# Patient Record
Sex: Male | Born: 1990 | Race: White | Hispanic: No | Marital: Single | Smoking: Light tobacco smoker
Health system: Southern US, Community
[De-identification: ages and names within clinical notes are randomized; demographics above are authoritative.]

## PROBLEM LIST (undated history)

## (undated) DIAGNOSIS — F32A Depression, unspecified: Secondary | ICD-10-CM

## (undated) DIAGNOSIS — F909 Attention-deficit hyperactivity disorder, unspecified type: Secondary | ICD-10-CM

## (undated) DIAGNOSIS — S060XAA Concussion with loss of consciousness status unknown, initial encounter: Secondary | ICD-10-CM

## (undated) DIAGNOSIS — F419 Anxiety disorder, unspecified: Secondary | ICD-10-CM

## (undated) DIAGNOSIS — S060X9A Concussion with loss of consciousness of unspecified duration, initial encounter: Secondary | ICD-10-CM

## (undated) DIAGNOSIS — F329 Major depressive disorder, single episode, unspecified: Secondary | ICD-10-CM

## (undated) DIAGNOSIS — N200 Calculus of kidney: Secondary | ICD-10-CM

## (undated) HISTORY — PX: LITHOTRIPSY: SUR834

---

## 2012-10-10 ENCOUNTER — Emergency Department: Payer: Self-pay | Admitting: Emergency Medicine

## 2012-10-10 LAB — COMPREHENSIVE METABOLIC PANEL
Albumin: 4.3 g/dL (ref 3.8–5.6)
Anion Gap: 5 — ABNORMAL LOW (ref 7–16)
Calcium, Total: 9.4 mg/dL (ref 9.0–10.7)
Co2: 28 mmol/L (ref 21–32)
Creatinine: 1.08 mg/dL (ref 0.60–1.30)
EGFR (African American): >60
Osmolality: 277 (ref 275–301)
Total Protein: 7.6 g/dL (ref 6.4–8.6)

## 2012-10-10 LAB — CBC
MCHC: 34 g/dL (ref 32.0–36.0)
MCV: 90 fL (ref 80–100)
Platelet: 192 10*3/uL (ref 150–440)
RDW: 13 % (ref 11.5–14.5)

## 2012-10-10 LAB — URINALYSIS, COMPLETE
Bacteria: NONE SEEN
Bilirubin,UR: NEGATIVE
Leukocyte Esterase: NEGATIVE
Nitrite: NEGATIVE
RBC,UR: 64 /HPF (ref 0–5)
WBC UR: 1 /HPF (ref 0–5)

## 2014-01-26 IMAGING — CT CT ABD-PELV W/ CM
1 of 2 series · 15 of 32 positions shown, 19 images · non-contrast
Comparison: none

REASON FOR EXAM: (1) RLQ pain; (2) RLQ pain
COMMENTS:   May transport without cardiac monitor

[Series 2: soft tissue · axial · 0.66mm/px · z∈[-500,-96]mm · 15 of 149 slices shown, 19 images]
[im 7/149  soft-tissue]
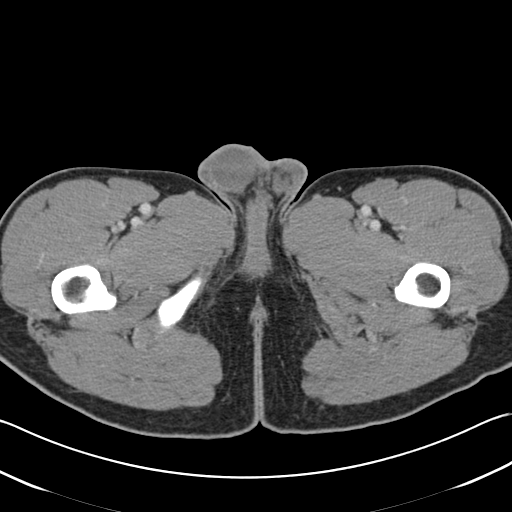
[im 7/149  bone]
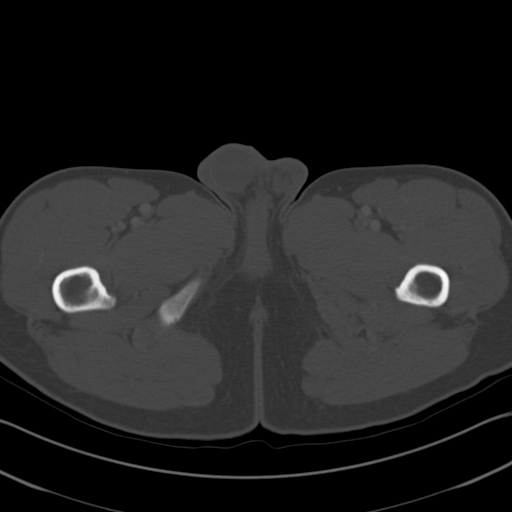
[im 19/149  soft-tissue]
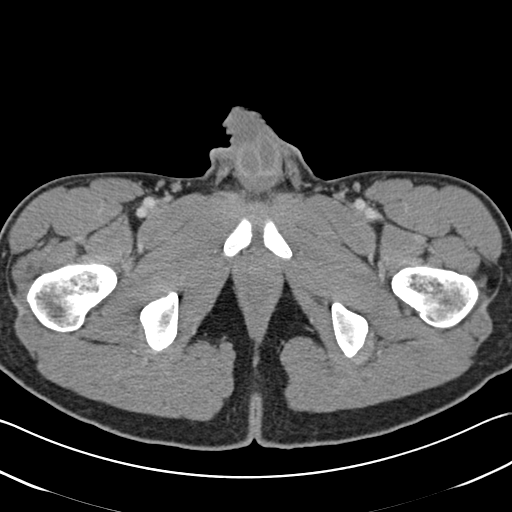
[im 31/149  soft-tissue]
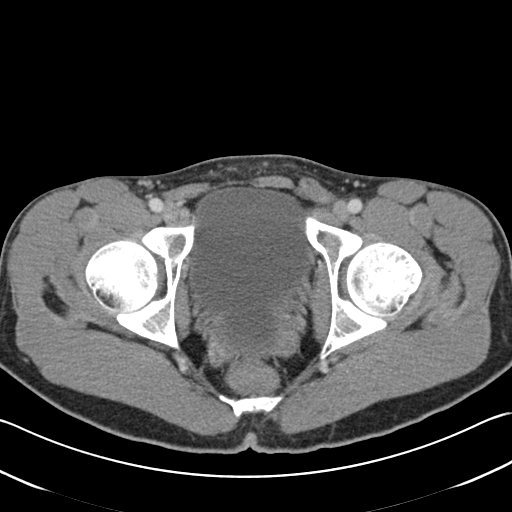
[im 44/149  soft-tissue]
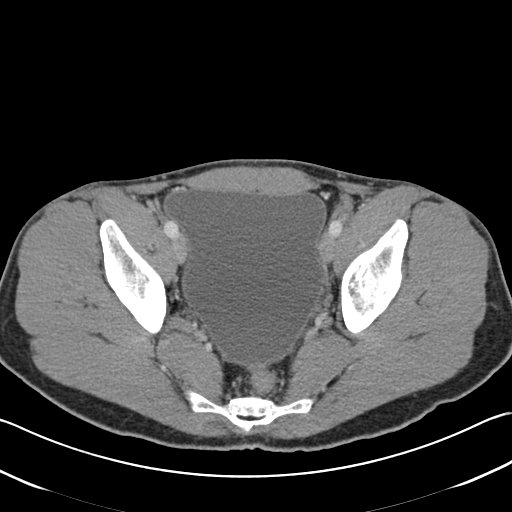
[im 50/149  soft-tissue]
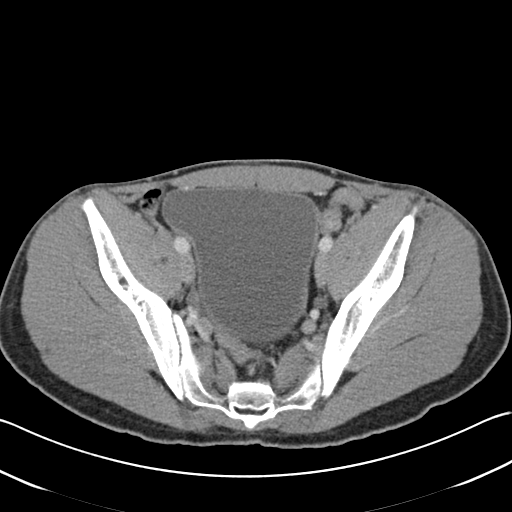
[im 62/149  soft-tissue]
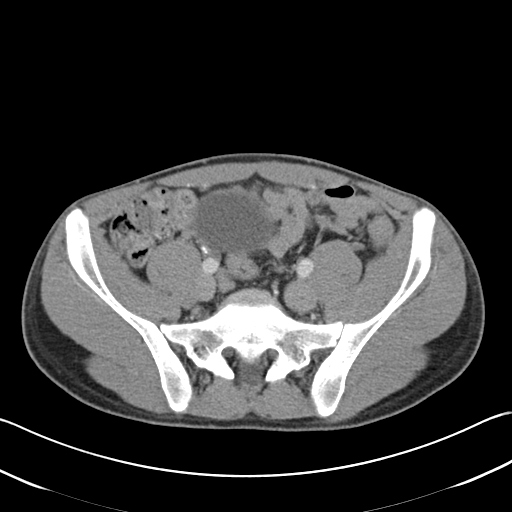
[im 75/149  soft-tissue]
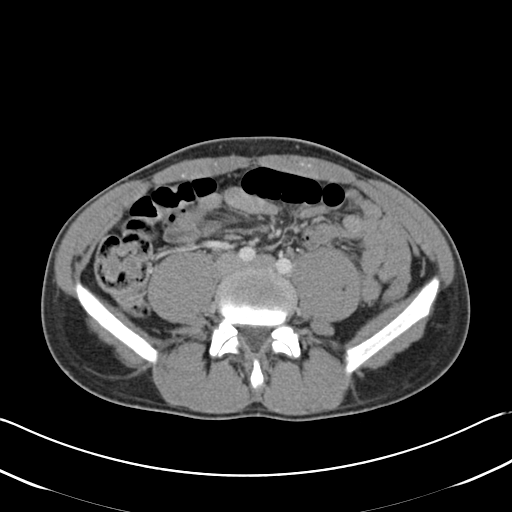
[im 87/149  soft-tissue]
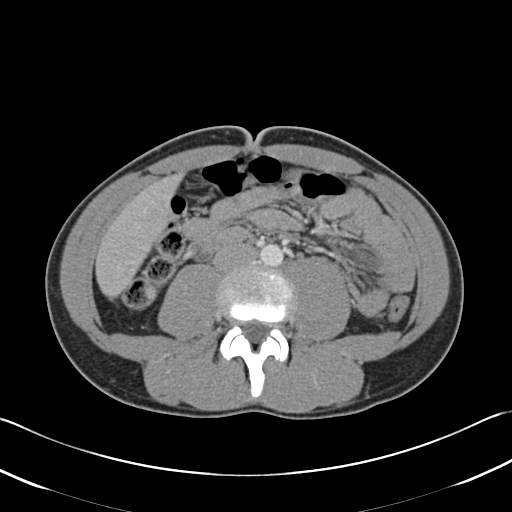
[im 99/149  soft-tissue]
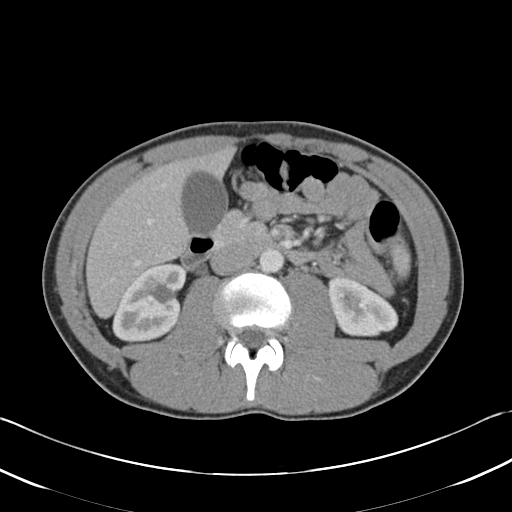
[im 99/149  bone]
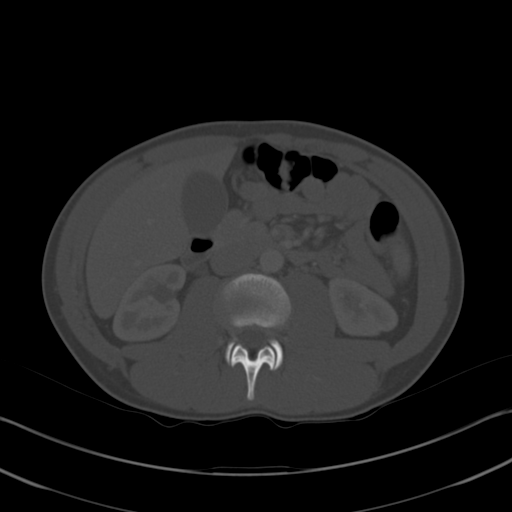
[im 105/149  soft-tissue]
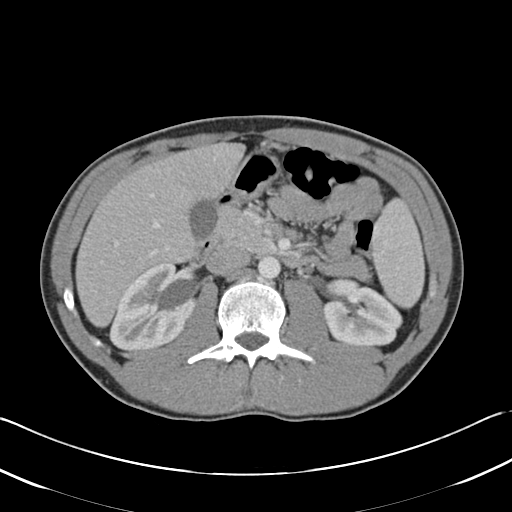
[im 118/149  soft-tissue]
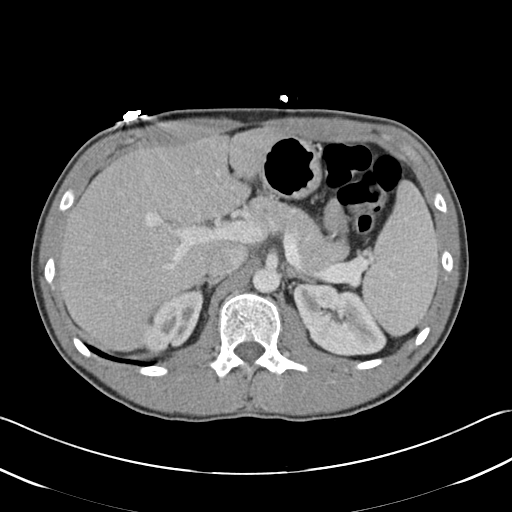
[im 124/149  lung]
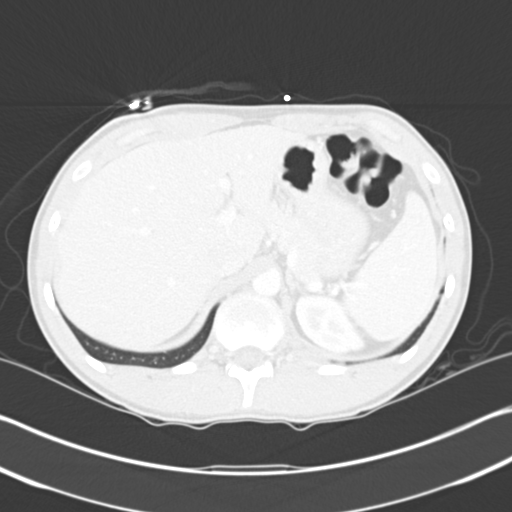
[im 130/149  soft-tissue]
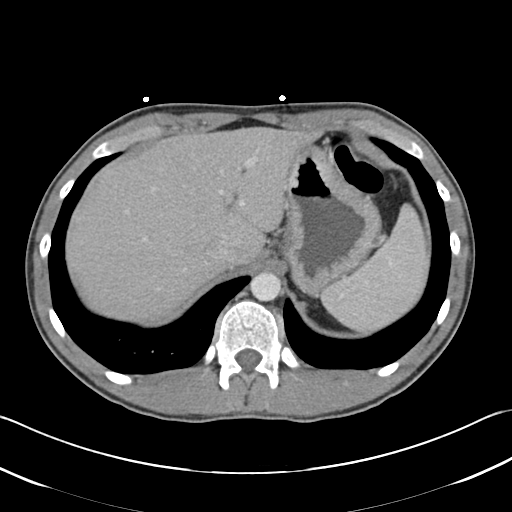
[im 130/149  lung]
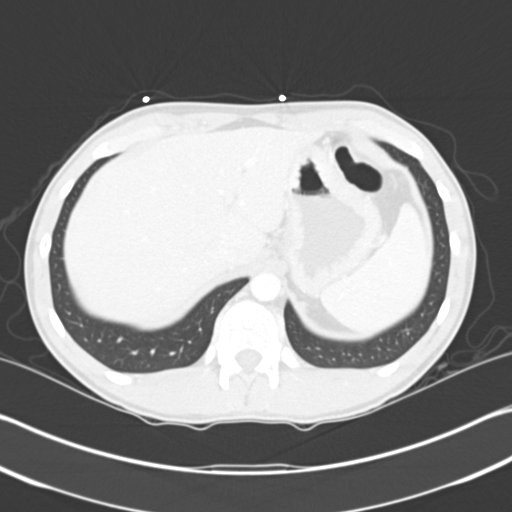
[im 136/149  lung]
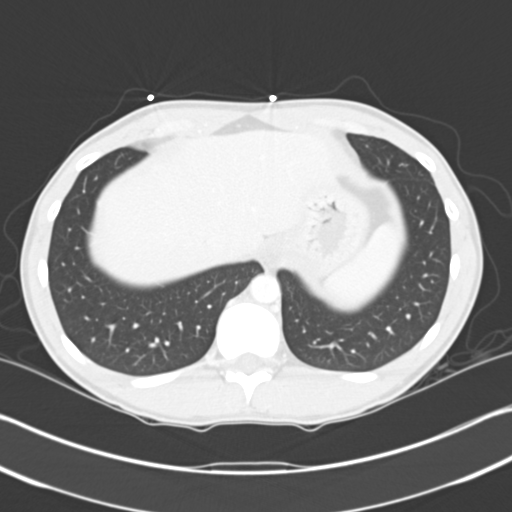
[im 142/149  soft-tissue]
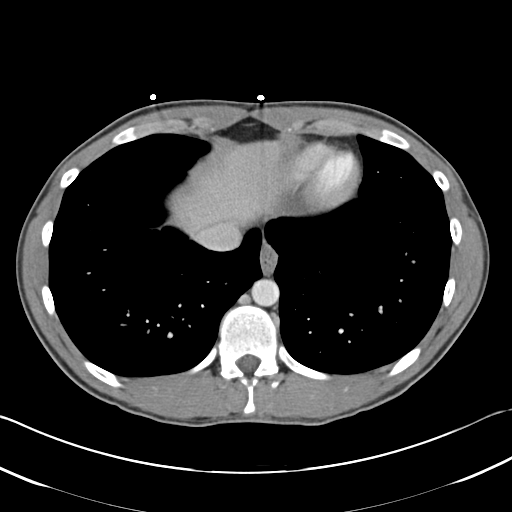
[im 142/149  lung]
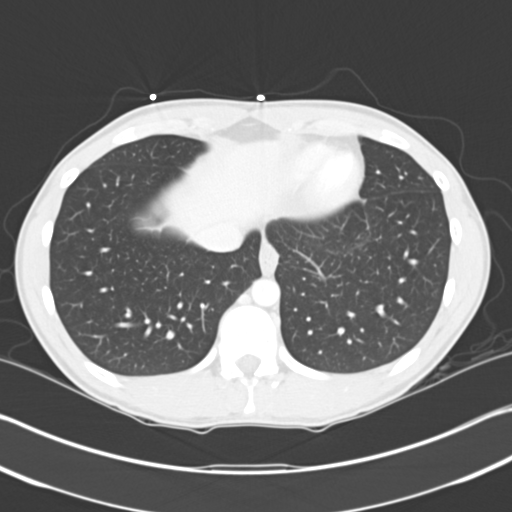

[15 of 32 positions shown; findings below may reference images not displayed]

PROCEDURE:     CT  - CT ABDOMEN / PELVIS  W  - October 10, 2012  [DATE]

RESULT:     CT scanning was performed through the abdomen and pelvis with
reconstructions at 3 mm intervals and slice thicknesses following
intravenous administration of 100 cc of Zsovue-ZUU. The patient did not
receive oral contrast material.

The urinary bladder is markedly distended. The prostate gland and seminal
vesicles are grossly normal for age. The kidneys exhibit no hydronephrosis.
There is a nonobstructing 2 mm diameter stone in an upper pole calyx on the
right.

The unopacified loops of small and large bowel exhibit no evidence of ileus
nor of obstruction. There is a structure which may reflect the appendix
lying along the right posterior aspect of the urinary bladder. It is not
clearly inflated. There is no free fluid in the abdomen or pelvis.

The liver, gallbladder, spleen, partially distended stomach, adrenal glands,
and pancreas exhibit no acute abnormalities. The caliber of the abdominal
aorta is normal. There is no periaortic nor pericaval lymphadenopathy.

The lung bases are clear. The lumbar vertebral bodies are preserved in
height.
IMPRESSION: 1. There is marked distention of the urinary bladder that is of uncertain
etiology. If the patient is unable to void, Foley catheterization may be
useful.
2. I do not see objective evidence of an inflamed appendix. A structure most
compatible with a normal calibered appendix with a possible appendicolith is
noted posterior to the right aspect of the urinary bladder on images 88
through 104.
3. There is no evidence of bowel obstruction or ileus.
4. There is no acute hepatobiliary abnormality.
5. There is a nonobstructing upper pole stone in the right kidney. There is
no evidence of pyelonephritis.

[REDACTED]

## 2014-11-21 ENCOUNTER — Emergency Department
Admit: 2014-11-21 | Disposition: A | Discharge status: Home / Self Care | Payer: Self-pay | Admitting: Emergency Medicine
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
## 2016-04-14 ENCOUNTER — Emergency Department
Admission: EM | Admit: 2016-04-14 | Discharge: 2016-04-14 | Disposition: A | Discharge status: Home / Self Care | Payer: 59 | Attending: Student in an Organized Health Care Education/Training Program | Admitting: Student in an Organized Health Care Education/Training Program

## 2016-04-14 ENCOUNTER — Encounter: Payer: Self-pay | Admitting: Emergency Medicine

## 2016-04-14 ENCOUNTER — Emergency Department: Payer: 59

## 2016-04-14 DIAGNOSIS — Y999 Unspecified external cause status: Secondary | ICD-10-CM | POA: Diagnosis not present

## 2016-04-14 DIAGNOSIS — Y9302 Activity, running: Secondary | ICD-10-CM | POA: Diagnosis not present

## 2016-04-14 DIAGNOSIS — S9031XA Contusion of right foot, initial encounter: Secondary | ICD-10-CM | POA: Diagnosis not present

## 2016-04-14 DIAGNOSIS — Y92520 Airport as the place of occurrence of the external cause: Secondary | ICD-10-CM | POA: Diagnosis not present

## 2016-04-14 DIAGNOSIS — W230XXA Caught, crushed, jammed, or pinched between moving objects, initial encounter: Secondary | ICD-10-CM | POA: Diagnosis not present

## 2016-04-14 DIAGNOSIS — F909 Attention-deficit hyperactivity disorder, unspecified type: Secondary | ICD-10-CM | POA: Diagnosis not present

## 2016-04-14 DIAGNOSIS — S9781XA Crushing injury of right foot, initial encounter: Secondary | ICD-10-CM

## 2016-04-14 DIAGNOSIS — F17200 Nicotine dependence, unspecified, uncomplicated: Secondary | ICD-10-CM | POA: Diagnosis not present

## 2016-04-14 DIAGNOSIS — S99921A Unspecified injury of right foot, initial encounter: Secondary | ICD-10-CM | POA: Diagnosis present

## 2016-04-14 HISTORY — DX: Calculus of kidney: N20.0

## 2016-04-14 HISTORY — DX: Major depressive disorder, single episode, unspecified: F32.9

## 2016-04-14 HISTORY — DX: Anxiety disorder, unspecified: F41.9

## 2016-04-14 HISTORY — DX: Attention-deficit hyperactivity disorder, unspecified type: F90.9

## 2016-04-14 HISTORY — DX: Depression, unspecified: F32.A

## 2016-04-14 HISTORY — DX: Concussion with loss of consciousness of unspecified duration, initial encounter: S06.0X9A

## 2016-04-14 HISTORY — DX: Concussion with loss of consciousness status unknown, initial encounter: S06.0XAA

## 2016-04-14 MED ORDER — TRAMADOL HCL 50 MG PO TABS: 50.0000 mg | ORAL_TABLET | Freq: Four times a day (QID) | ORAL | 0 refills | Status: DC | PRN

## 2016-04-14 MED ORDER — NAPROXEN 500 MG PO TABS: 500.0000 mg | ORAL_TABLET | Freq: Two times a day (BID) | ORAL | 0 refills | Status: DC
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
## 2016-04-14 NOTE — Discharge Instructions (Signed)
Follow up with Dr. Orland Jarredroxler for symptoms that are not improving over the week.  Return to the ER for symptoms that change or worsen if unable to schedule an appointment.

## 2016-04-14 NOTE — ED Notes (Signed)
Reviewed d/c instructions, follow-up care and prescriptions with pt. Pt verbalized understanding 
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
## 2016-05-06 ENCOUNTER — Emergency Department
Admission: EM | Admit: 2016-05-06 | Discharge: 2016-05-10 | Disposition: A | Discharge status: Other Acute Inpatient Hospital | Payer: 59 | Attending: Emergency Medicine | Admitting: Emergency Medicine

## 2016-05-06 ENCOUNTER — Encounter: Payer: Self-pay | Admitting: Emergency Medicine

## 2016-05-06 DIAGNOSIS — Z5181 Encounter for therapeutic drug level monitoring: Secondary | ICD-10-CM | POA: Insufficient documentation

## 2016-05-06 DIAGNOSIS — R4689 Other symptoms and signs involving appearance and behavior: Secondary | ICD-10-CM | POA: Diagnosis present

## 2016-05-06 DIAGNOSIS — F17210 Nicotine dependence, cigarettes, uncomplicated: Secondary | ICD-10-CM | POA: Insufficient documentation

## 2016-05-06 DIAGNOSIS — F3160 Bipolar disorder, current episode mixed, unspecified: Secondary | ICD-10-CM | POA: Insufficient documentation

## 2016-05-06 DIAGNOSIS — F909 Attention-deficit hyperactivity disorder, unspecified type: Secondary | ICD-10-CM | POA: Insufficient documentation

## 2016-05-06 DIAGNOSIS — F3164 Bipolar disorder, current episode mixed, severe, with psychotic features: Secondary | ICD-10-CM

## 2016-05-06 LAB — URINE DRUG SCREEN, QUALITATIVE (ARMC ONLY)
Amphetamines, Ur Screen: NOT DETECTED
Barbiturates, Ur Screen: NOT DETECTED
Benzodiazepine, Ur Scrn: NOT DETECTED
CANNABINOID 50 NG, UR ~~LOC~~: POSITIVE — AB
COCAINE METABOLITE, UR ~~LOC~~: NOT DETECTED
MDMA (ECSTASY) UR SCREEN: NOT DETECTED
Methadone Scn, Ur: NOT DETECTED
OPIATE, UR SCREEN: NOT DETECTED
PHENCYCLIDINE (PCP) UR S: NOT DETECTED
Tricyclic, Ur Screen: NOT DETECTED

## 2016-05-06 LAB — COMPREHENSIVE METABOLIC PANEL
ALT: 44 U/L (ref 17–63)
AST: 36 U/L (ref 15–41)
Albumin: 5.2 g/dL — ABNORMAL HIGH (ref 3.5–5.0)
Alkaline Phosphatase: 79 U/L (ref 38–126)
Anion gap: 8 (ref 5–15)
BUN: 18 mg/dL (ref 6–20)
CO2: 27 mmol/L (ref 22–32)
CREATININE: 1.1 mg/dL (ref 0.61–1.24)
Calcium: 9.8 mg/dL (ref 8.9–10.3)
Chloride: 103 mmol/L (ref 101–111)
GFR calc Af Amer: >60 mL/min (ref >60)
Glucose, Bld: 93 mg/dL (ref 65–99)
Potassium: 3.8 mmol/L (ref 3.5–5.1)
Sodium: 138 mmol/L (ref 135–145)
Total Bilirubin: 0.9 mg/dL (ref 0.3–1.2)
Total Protein: 7.8 g/dL (ref 6.5–8.1)

## 2016-05-06 LAB — SALICYLATE LEVEL: Salicylate Lvl: <4 mg/dL (ref 2.8–30.0)

## 2016-05-06 LAB — CBC
HCT: 45 % (ref 40.0–52.0)
Hemoglobin: 15.2 g/dL (ref 13.0–18.0)
MCH: 30.1 pg (ref 26.0–34.0)
MCHC: 33.9 g/dL (ref 32.0–36.0)
MCV: 88.7 fL (ref 80.0–100.0)
Platelets: 202 10*3/uL (ref 150–440)
RBC: 5.07 MIL/uL (ref 4.40–5.90)
RDW: 13 % (ref 11.5–14.5)
WBC: 7.7 10*3/uL (ref 3.8–10.6)

## 2016-05-06 LAB — ACETAMINOPHEN LEVEL: Acetaminophen (Tylenol), Serum: <10 ug/mL — ABNORMAL LOW (ref 10–30)

## 2016-05-06 LAB — ETHANOL

## 2016-05-06 MED ORDER — DIPHENHYDRAMINE HCL 25 MG PO CAPS
50.0000 mg | ORAL_CAPSULE | Freq: Once | ORAL | Status: AC
  Administered 2016-05-06: 50 mg via ORAL

## 2016-05-06 MED ORDER — HALOPERIDOL 5 MG PO TABS
5.0000 mg | ORAL_TABLET | Freq: Once | ORAL | Status: AC
  Administered 2016-05-06: 5 mg via ORAL

## 2016-05-06 MED ORDER — HALOPERIDOL 5 MG PO TABS
ORAL_TABLET | ORAL | Status: AC
  Administered 2016-05-06: 5 mg via ORAL
  Filled 2016-05-06: qty 1

## 2016-05-06 MED ORDER — LORAZEPAM 2 MG PO TABS
2.0000 mg | ORAL_TABLET | Freq: Once | ORAL | Status: AC
  Administered 2016-05-06: 2 mg via ORAL

## 2016-05-06 MED ORDER — LORAZEPAM 2 MG PO TABS
ORAL_TABLET | ORAL | Status: AC
  Administered 2016-05-06: 2 mg via ORAL
  Filled 2016-05-06: qty 1

## 2016-05-06 MED ORDER — DIPHENHYDRAMINE HCL 25 MG PO CAPS
ORAL_CAPSULE | ORAL | Status: AC
  Filled 2016-05-06: qty 2

## 2016-05-06 NOTE — ED Notes (Signed)
After showing pt to his room pt became agitated stating he feels like [place] is on [place] and that he is thinking of suing his psychiatrist because the IVC papers are not truthful. Attempted to calm pt and pt continues to talk loudly. Pt stretched his legs apart tearing his paper scrubs and then ripped his top. Pt slammed his door telling this RN and the other RN on the unit to leave. Dr Derrill KayGoodman called for orders.

## 2016-05-06 NOTE — ED Notes (Signed)
BEHAVIORAL HEALTH ROUNDING  Patient sleeping: No.  Patient alert and oriented: yes  Behavior appropriate: Yes. ; If no, describe:  Nutrition and fluids offered: Yes  Toileting and hygiene offered: Yes  Sitter present: not applicable, Q 15 min safety rounds and observation via security camera. Law enforcement present: Yes ODS  

## 2016-05-06 NOTE — ED Notes (Signed)
Pt agreeable to take oral medications to help him rest and calm down.

## 2016-05-06 NOTE — ED Notes (Signed)
Per dr pt has multiple superficial lacerations to his thigh that do not need sutures. Clean with soap and water

## 2016-05-06 NOTE — BH Assessment (Signed)
Assessment Note  Philip French is an 25 y.o. male presenting to the ED under IVC, initiated by [facility].  According to the IVC, patient called his sister to stay goodbye and that he was going to kill himself.  Patient also had superficial cuts on his legs.  Patient reportedly admitted to the therapist at [facility] that was having suicidal thoughts with plans to suffocate himself with a plastic bag.  The paperwork also states that patient was upset with his professor and had made comments that he would like to skin and butcher and his professor.   Patient denies being suicidal.  He reports that when he called his sister to tell her goodbye, he stated, "I only meant that I was saying goodbye to our relationship and not that I was actually going to kill myself".  Patient states that he had called his therapist, Elita Quick, while at [facility] and told her that he was being brought in to the ED for hospitalization.  He states that Ms. Hart Rochester reportedly told [facility] that he needs to placed back on his medications.  Patient also reports that he had called and left a message with Dr. Shary Key office regarding scheduling an appointment for medication management. Pt denies SI/HI and any visual/auditory hallucinations.  This Clinical research associate spoke with patient's mother Marlee Trentman [phone]).  Ms. Toomey states that patient has never had any psychiatric hospitalizations and the only other time he has been in therapy was when patient was in the first grade.  Ms. Henreitta Leber expressed concern about patient being expelled from school because of the IVC.  Patient's sister, who is a medical doctor, states that she feels that he needs to be hospitalized and states that the family would prefer for him to be placed in [place].      Diagnosis: Anxiety, ADHD  Past Medical History:  Past Medical History:  Diagnosis Date  . ADHD (attention deficit hyperactivity disorder)   . Anxiety   . Concussion   . Depression   . Kidney stone      Past Surgical History:  Procedure Laterality Date  . LITHOTRIPSY      Family History: No family history on file.  Social History:  reports that he has been smoking Cigarettes.  He has never used smokeless tobacco. He reports that he drinks alcohol. He reports that he does not use drugs.  Additional Social History:  Alcohol / Drug Use History of alcohol / drug use?: No history of alcohol / drug abuse  CIWA: CIWA-Ar BP: 110/63 Pulse Rate: 62 COWS:    Allergies: No Known Allergies  Home Medications:  (Not in a hospital admission)  OB/GYN Status:  No LMP for male patient.  General Assessment Data Location of Assessment: [facility] ED TTS Assessment: In system Is this a Tele or Face-to-Face Assessment?: Face-to-Face Is this an Initial Assessment or a Re-assessment for this encounter?: Initial Assessment Marital status: Single Maiden name: n/a Is patient pregnant?: No Pregnancy Status: No Living Arrangements: Non-relatives/Friends Can pt return to current living arrangement?: No Admission Status: Involuntary Is patient capable of signing voluntary admission?: No Referral Source: Self/Family/Friend Insurance type: Research officer, trade union Exam ([facility] Walk-in ONLY) Medical Exam completed: Yes  Crisis Care Plan Living Arrangements: Non-relatives/Friends Legal Guardian: Other: (self) Name of Psychiatrist: [facility] Name of Therapist: Elita Quick  Education Status Is patient currently in school?: Yes Current Grade: college Highest grade of school patient has completed: junior in college Name of school: [employer] person: N/A  Risk to self with the past 6 months Suicidal Ideation: Yes-Currently Present Has patient been a risk to self within the past 6 months prior to admission? : No Suicidal Intent: No Has patient had any suicidal intent within the past 6 months prior to admission? : No Is patient at risk for suicide?: No Suicidal Plan?: No (Pt  denies intent or plan) Has patient had any suicidal plan within the past 6 months prior to admission? : No Access to Means: No What has been your use of drugs/alcohol within the last 12 months?: Pt had reported access to a bag to suffocate himself Previous Attempts/Gestures: No How many times?: 0 Other Self Harm Risks: history of cutting Triggers for Past Attempts: None known Intentional Self Injurious Behavior: Cutting Comment - Self Injurious Behavior: Previous history of cutting Family Suicide History: No Recent stressful life event(s): Other (Comment) (Academic problems) Persecutory voices/beliefs?: Yes Depression: Yes Depression Symptoms: Feeling angry/irritable, Despondent Substance abuse history and/or treatment for substance abuse?: No Suicide prevention information given to non-admitted patients: Not applicable  Risk to Others within the past 6 months Homicidal Ideation: No Does patient have any lifetime risk of violence toward others beyond the six months prior to admission? : No Thoughts of Harm to Others: No Current Homicidal Intent: No Current Homicidal Plan: No Access to Homicidal Means: No Identified Victim: none identified History of harm to others?: No Assessment of Violence: None Noted Violent Behavior Description: none identified Does patient have access to weapons?: No Criminal Charges Pending?: No Does patient have a court date: No Is patient on probation?: No  Psychosis Hallucinations: None noted Delusions: Persecutory  Mental Status Report Appearance/Hygiene: In scrubs Eye Contact: Fair Motor Activity: Freedom of movement, Agitation Speech: Rapid, Logical/coherent Level of Consciousness: Alert, Restless, Irritable Mood: Anxious, Angry, Irritable Affect: Depressed, Irritable, Anxious Anxiety Level: Minimal Thought Processes: Coherent, Relevant Judgement: Partial Orientation: Person, Place, Time, Situation, Appropriate for developmental  age Obsessive Compulsive Thoughts/Behaviors: Minimal  Cognitive Functioning Concentration: Normal Memory: Recent Intact, Remote Intact IQ: Average Insight: Fair Impulse Control: Fair Appetite: Fair Weight Loss: 0 Weight Gain: 0 Sleep: No Change Vegetative Symptoms: None  ADLScreening [facility] Assessment Services) Patient's cognitive ability adequate to safely complete daily activities?: Yes Patient able to express need for assistance with ADLs?: Yes Independently performs ADLs?: Yes (appropriate for developmental age)  Prior Inpatient Therapy Prior Inpatient Therapy: No Prior Therapy Dates: n/a Prior Therapy Facilty/Provider(s): n/a Reason for Treatment: n/a  Prior Outpatient Therapy Prior Outpatient Therapy: Yes Prior Therapy Dates: current Prior Therapy Facilty/Provider(s): Elita Quickheryl Lawson Reason for Treatment: bipolar disorder Does patient have an ACCT team?: No Does patient have Intensive In-House Services?  : No Does patient have [facility] services? : No Does patient have P4CC services?: No  ADL Screening (condition at time of admission) Patient's cognitive ability adequate to safely complete daily activities?: Yes Patient able to express need for assistance with ADLs?: Yes Independently performs ADLs?: Yes (appropriate for developmental age)       Abuse/Neglect Assessment (Assessment to be complete while patient is alone) Physical Abuse: Denies Verbal Abuse: Denies Sexual Abuse: Denies Exploitation of patient/patient's resources: Denies Self-Neglect: Denies Values / Beliefs Cultural Requests During Hospitalization: None Spiritual Requests During Hospitalization: None Consults Spiritual Care Consult Needed: No Social Work Consult Needed: No Merchant navy officerAdvance Directives (For Healthcare) Does patient have an advance directive?: No Would patient like information on creating an advanced directive?: Yes - Educational materials given    Additional Information 1:1 In Past 12  Months?: No  CIRT Risk: No Elopement Risk: No Does patient have medical clearance?: Yes     Disposition:  Disposition Initial Assessment Completed for this Encounter: Yes Disposition of Patient: Other dispositions Other disposition(s): Other (Comment) (Pending Psych MD consult)  On Site Evaluation by:   Reviewed with Physician:    [place] 05/06/2016 9:57 PM

## 2016-05-06 NOTE — ED Triage Notes (Signed)
Patient states "I have been having a terrible time at school, cutting self to relieve the stress.  Today patient states "I over reacted, and a therapist at school  Said I am suicidal".  Patient expressing frustration with having to be at the hospital.  Denies SI/ HI.  Admits to cutting self for about a year, intermittently, to right thigh.

## 2016-05-06 NOTE — ED Provider Notes (Signed)
[facility] Emergency Department Provider Note    ____________________________________________   I have reviewed the triage vital signs and the nursing notes.   HISTORY  Chief Complaint Mental Health Problem   History limited by: Not Limited   HPI Philip French is a 25 y.o. male who presents to the emergency department today under IVC because of concerns for suicidal and homicidal ideation. The patient himself states that it was all misunderstanding. He was on the phone with his sister and made some comments which she states she misinterpreted. He states he is very little in figured speaker. He states that he might have mentioned to her once that he had a dream about wanting to hurt someone and she took that tonight he literally wanted to hurt someone. She does admit to cutting of his right leg although he states this is something is done in the past and is not a true attempt to harm himself.He denies any medical complaints.      Past Medical History:  Diagnosis Date  . ADHD (attention deficit hyperactivity disorder)   . Anxiety   . Concussion   . Depression   . Kidney stone     There are no active problems to display for this patient.   Past Surgical History:  Procedure Laterality Date  . LITHOTRIPSY      Prior to Admission medications   Medication Sig Start Date End Date Taking? Authorizing Provider  naproxen (NAPROSYN) 500 MG tablet Take 1 tablet (500 mg total) by mouth 2 (two) times daily with a meal. 04/14/16   Cari B Triplett, FNP  traMADol (ULTRAM) 50 MG tablet Take 1 tablet (50 mg total) by mouth every 6 (six) hours as needed. 04/14/16   Chinita Pester, FNP    Allergies Review of patient's allergies indicates no known allergies.  No family history on file.  Social History Social History  Substance Use Topics  . Smoking status: Light Tobacco Smoker    Types: Cigarettes  . Smokeless tobacco: Never Used  . Alcohol use Yes     Review of Systems  Constitutional: Negative for fever. Cardiovascular: Negative for chest pain. Respiratory: Negative for shortness of breath. Gastrointestinal: Negative for abdominal pain, vomiting and diarrhea. Genitourinary: Negative for dysuria. Musculoskeletal: Negative for back pain. Skin: Multiple lacerations over right thigh. Neurological: Negative for headaches, focal weakness or numbness.  10-point ROS otherwise negative.  ____________________________________________   PHYSICAL EXAM:  VITAL SIGNS: ED Triage Vitals  Enc Vitals Group     BP 05/06/16 1833 119/66     Pulse Rate 05/06/16 1833 (!) 58     Resp 05/06/16 1833 16     Temp 05/06/16 1833 98 F (36.7 C)     Temp Source 05/06/16 1833 Oral     SpO2 05/06/16 1833 98 %     Weight 05/06/16 1835 180 lb (81.6 kg)     Height 05/06/16 1835 5\' 9"  (1.753 m)     Head Circumference --      Peak Flow --      Pain Score 05/06/16 1835 0   Constitutional: Alert and oriented. Well appearing and in no distress. Eyes: Conjunctivae are normal. Normal extraocular movements. ENT   Head: Normocephalic and atraumatic.   Nose: No congestion/rhinnorhea.   Mouth/Throat: Mucous membranes are moist.   Neck: No stridor. Hematological/Lymphatic/Immunilogical: No cervical lymphadenopathy. Cardiovascular: Normal rate, regular rhythm.  No murmurs, rubs, or gallops. Respiratory: Normal respiratory effort without tachypnea nor retractions. Breath sounds are  clear and equal bilaterally. No wheezes/rales/rhonchi. Gastrointestinal: Soft and nontender. No distention.  Genitourinary: Deferred Musculoskeletal: Normal range of motion in all extremities. No lower extremity edema. Neurologic:  Normal speech and language. No gross focal neurologic deficits are appreciated.  Skin:  Skin is warm, dry. Multiple superficial lacerations of the right thigh, none with surrounding erythema.  Psychiatric: Denies  SI/HI ____________________________________________    LABS (pertinent positives/negatives)  Labs Reviewed  COMPREHENSIVE METABOLIC PANEL - Abnormal; Notable for the following:       Result Value   Albumin 5.2 (*)    All other components within normal limits  ACETAMINOPHEN LEVEL - Abnormal; Notable for the following:    Acetaminophen (Tylenol), Serum <10 (*)    All other components within normal limits  URINE DRUG SCREEN, QUALITATIVE ([facility] ONLY) - Abnormal; Notable for the following:    Cannabinoid 50 Ng, Ur [place] POSITIVE (*)    All other components within normal limits  ETHANOL  SALICYLATE LEVEL  CBC     ____________________________________________   EKG  None  ____________________________________________    RADIOLOGY  None   ____________________________________________   PROCEDURES  Procedures  ____________________________________________   INITIAL IMPRESSION / ASSESSMENT AND PLAN / ED COURSE  Pertinent labs & imaging results that were available during my care of the patient were reviewed by me and considered in my medical decision making (see chart for details).  Patient presented to the emergency department under IVC because of concerns for suicidal ideation and threatening to hurt others. On exam patient denies SI or HI however does admit to recent cutting. These are all very superficial and did not require any advanced closure. Patient does have a history of suicide attempts and I do have some concern for thoughts of self-harm. Will have psychiatry see patient in the morning. ____________________________________________   FINAL CLINICAL IMPRESSION(S) / ED DIAGNOSES  Laceration Depression  Note: This dictation was prepared with Dragon dictation. Any transcriptional errors that result from this process are unintentional    Phineas SemenGraydon Kristia Jupiter, MD 05/06/16 2351

## 2016-05-06 NOTE — ED Notes (Signed)
Pt states still unable to urinate. Pt asked why we needed it and explained we need to see if his behavior is being caused by drugs or infection. Pt states understanding

## 2016-05-06 NOTE — ED Notes (Signed)
ENVIRONMENTAL ASSESSMENT  Potentially harmful objects out of patient reach: Yes.  Personal belongings secured: Yes.  Patient dressed in hospital provided attire only: Yes.  Plastic bags out of patient reach: Yes.  Patient care equipment (cords, cables, call bells, lines, and drains) shortened, removed, or accounted for: Yes.  Equipment and supplies removed from bottom of stretcher: Yes.  Potentially toxic materials out of patient reach: Yes.  Sharps container removed or out of patient reach: Yes.   BEHAVIORAL HEALTH ROUNDING  Patient sleeping: No.  Patient alert and oriented: yes  Behavior appropriate: Yes. ; If no, describe:  Nutrition and fluids offered: Yes  Toileting and hygiene offered: Yes  Sitter present: not applicable, Q 15 min safety rounds and observation via security camera. Law enforcement present: Yes ODS  ED BHU PLACEMENT JUSTIFICATION  Is the patient under IVC or is there intent for IVC: Yes.  Is the patient medically cleared: Yes.  Is there vacancy in the ED BHU: Yes.  Is the population mix appropriate for patient: Yes.  Is the patient awaiting placement in inpatient or outpatient setting: Yes.  Has the patient had a psychiatric consult:No pending.  Survey of unit performed for contraband, proper placement and condition of furniture, tampering with fixtures in bathroom, shower, and each patient room: Yes. ; Findings: All clear  APPEARANCE/BEHAVIOR  calm, cooperative and adequate rapport can be established  NEURO ASSESSMENT  Orientation: time, place and person  Hallucinations: No.None noted (Hallucinations)  Speech: Normal  Gait: normal  RESPIRATORY ASSESSMENT  WNL  CARDIOVASCULAR ASSESSMENT  WNL  GASTROINTESTINAL ASSESSMENT  WNL  EXTREMITIES  WNL  PLAN OF CARE  Provide calm/safe environment. Vital signs assessed twice daily. ED BHU Assessment once each 12-hour shift. Collaborate with TTS daily or as condition indicates. Assure the ED provider has rounded  once each shift. Provide and encourage hygiene. Provide redirection as needed. Assess for escalating behavior; address immediately and inform ED provider.  Assess family dynamic and appropriateness for visitation as needed: Yes. ; If necessary, describe findings:  Educate the patient/family about BHU procedures/visitation: Yes. ; If necessary, describe findings: Pt is calm and cooperative at this time. Pt understanding and accepting of unit procedures/rules. Will continue to monitor with Q 15 min safety rounds and observation via security camera.   Pt brought into ED BHU via sally port and wand with metal detector for safety by ODS officer. Patient oriented to unit/care area: Pt informed of unit policies and procedures.  Informed that, for their safety, care areas are designed for safety and monitored by security cameras at all times; and visiting hours explained to patient. Patient verbalizes understanding, and unable to contract verbally for safety at this time. Pt shown to their room.

## 2016-05-07 ENCOUNTER — Inpatient Hospital Stay (HOSPITAL_COMMUNITY): Admission: AD | Admit: 2016-05-07 | Payer: 59 | Admitting: Psychiatry

## 2016-05-07 DIAGNOSIS — F3160 Bipolar disorder, current episode mixed, unspecified: Secondary | ICD-10-CM | POA: Diagnosis not present

## 2016-05-07 DIAGNOSIS — F3164 Bipolar disorder, current episode mixed, severe, with psychotic features: Secondary | ICD-10-CM

## 2016-05-07 MED ORDER — ALPRAZOLAM 0.5 MG PO TABS
2.0000 mg | ORAL_TABLET | Freq: Once | ORAL | Status: AC
  Administered 2016-05-07: 2 mg via ORAL

## 2016-05-07 MED ORDER — LORAZEPAM 2 MG/ML IJ SOLN
INTRAMUSCULAR | Status: AC
  Administered 2016-05-07: 2 mg
  Filled 2016-05-07: qty 1

## 2016-05-07 MED ORDER — ZIPRASIDONE MESYLATE 20 MG IM SOLR
INTRAMUSCULAR | Status: AC
  Administered 2016-05-07: 20 mg via INTRAMUSCULAR
  Filled 2016-05-07: qty 20

## 2016-05-07 MED ORDER — QUETIAPINE FUMARATE 200 MG PO TABS
100.0000 mg | ORAL_TABLET | Freq: Two times a day (BID) | ORAL | Status: DC
  Administered 2016-05-08 – 2016-05-10 (×5): 100 mg via ORAL
  Filled 2016-05-07 (×6): qty 1

## 2016-05-07 MED ORDER — LORAZEPAM 2 MG/ML IJ SOLN: 2.0000 mg | INTRAMUSCULAR | Status: DC | PRN

## 2016-05-07 MED ORDER — HALOPERIDOL LACTATE 5 MG/ML IJ SOLN
INTRAMUSCULAR | Status: AC
Start: 2016-05-07 — End: 2016-05-07
  Administered 2016-05-07: 5 mg
  Filled 2016-05-07: qty 1

## 2016-05-07 MED ORDER — ALPRAZOLAM 0.5 MG PO TABS
ORAL_TABLET | ORAL | Status: AC
  Administered 2016-05-07: 2 mg via ORAL
  Filled 2016-05-07: qty 4

## 2016-05-07 MED ORDER — ZIPRASIDONE MESYLATE 20 MG IM SOLR
20.0000 mg | Freq: Once | INTRAMUSCULAR | Status: AC
  Administered 2016-05-07: 20 mg via INTRAMUSCULAR

## 2016-05-07 MED ORDER — ALPRAZOLAM 0.5 MG PO TABS
2.0000 mg | ORAL_TABLET | ORAL | Status: AC
  Administered 2016-05-07: 2 mg via ORAL

## 2016-05-07 MED ORDER — LORAZEPAM 2 MG/ML IJ SOLN
INTRAMUSCULAR | Status: AC
Start: 2016-05-07 — End: 2016-05-07
  Administered 2016-05-07: 2 mg
  Filled 2016-05-07: qty 1

## 2016-05-07 MED ORDER — ZIPRASIDONE MESYLATE 20 MG IM SOLR
20.0000 mg | Freq: Two times a day (BID) | INTRAMUSCULAR | Status: DC | PRN
  Administered 2016-05-09: 20 mg via INTRAMUSCULAR
  Filled 2016-05-07: qty 20

## 2016-05-07 MED ORDER — LORAZEPAM 2 MG PO TABS
2.0000 mg | ORAL_TABLET | ORAL | Status: DC | PRN
  Administered 2016-05-08 – 2016-05-10 (×5): 2 mg via ORAL
  Filled 2016-05-07 (×5): qty 1

## 2016-05-07 MED ORDER — LORAZEPAM 2 MG/ML IJ SOLN
2.0000 mg | Freq: Once | INTRAMUSCULAR | Status: AC
  Administered 2016-05-07: 2 mg via INTRAVENOUS
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
## 2016-05-07 NOTE — ED Notes (Addendum)
Patient is awake talking with Dr. Toni Amendlapacs.

## 2016-05-07 NOTE — ED Notes (Signed)
BEHAVIORAL HEALTH ROUNDING Patient sleeping: Yes.   Patient alert and oriented: not applicable SLEEPING Behavior appropriate: Yes.  ; If no, describe: SLEEPING Nutrition and fluids offered: No SLEEPING Toileting and hygiene offered: NoSLEEPING Sitter present: not applicable, Q 15 min safety rounds and observation via security camera. Law enforcement present: Yes ODS 
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
## 2016-05-07 NOTE — ED Notes (Signed)
Patient is currently laying in bed resting with eyes closed. Respirations un-labored and even. Will continue to monitor Q15 min checks.
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
## 2016-05-07 NOTE — ED Provider Notes (Signed)
-----------------------------------------   11:47 PM on 05/07/2016 ----------------------------------------- Patient reassessed at 10:30 PM. Sleeping. Nurses report that the patient was taken out of physical restraints at about 8:15 PM because he had calmed down and gone to sleep. He remains calm and not requiring any restraints.  Blood pressure 113/64, pulse 87, temperature 97.6 F (36.4 C), temperature source Oral, resp. rate 16, height 5\' 9"  (1.753 m), weight 180 lb (81.6 kg), SpO2 96 %.  The patient had no acute events since last update.  Calm and cooperative at this time.  Disposition is pending Psychiatry/Behavioral Medicine team recommendations.     Sharman CheekPhillip Carlos Quackenbush, MD 05/07/16 [phone]

## 2016-05-07 NOTE — ED Notes (Signed)
Pt given lunch tray.
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
## 2016-05-07 NOTE — ED Notes (Signed)
Patient is sleeping, no signs of distress. Respirations even and unlabored. q 15 min. Checks and camera monitoring in progress.

## 2016-05-07 NOTE — ED Notes (Signed)
Staff and security , law enforcement attempting to monitor patient, and Patient charged at  Emergency planning/management officerolice officer and Patient put in therapeutic hold and nurse did administered Im ativan in left gluteus. Patient continued with monitoring per staff and law enforcement.

## 2016-05-07 NOTE — ED Notes (Signed)
Pt  Was  Going  To  [place]  [facility]  [facility]  Pt  Became  Angry  Will  Not  Go  Now  Per  Dr Clapacs  . CAN  TRY  LATER TONIGHT  PER  DR CLAPACS 

## 2016-05-07 NOTE — ED Notes (Signed)
Patient continues to lay in bed with eyes closed. Respirations even and un-labored. Patient is currently restraint free.

## 2016-05-07 NOTE — Consult Note (Signed)
  Psychiatry: 25 year old [facility]. Information in the chart evaluated. Spoke with nursing. Spoke with the patient's sister, Christiana PellantKate Doyle, by telephone. Attempted to speak with patient. Patient is currently sedated and could not be aroused to the point of having a conversation. 25 year old man who I am told had recently been diagnosed with bipolar disorder. Called family and made statements about suicide. Recent cutting behavior. Had been prescribed medicine for bipolar disorder but reportedly had recently discontinued it. Drug screen positive for cannabis otherwise negative alcohol level on detected. Other lab values essentially unremarkable. Vital signs stable. Patient grossly appears to be a normal physical health.  Based on presentation and information at hand it seems clear that the safest thing to do is to admit him to the hospital. I am going to go ahead and put in orders for admission to the psychiatry ward downstairs. Sister was informed of the plan. She was informed that as soon as the patient is awake if he gives consent to speak with her she is welcome to visit with him. No other medication required at this particular time. Further evaluation pending. Up hold the commitment  in place at this point.

## 2016-05-07 NOTE — ED Notes (Signed)
Patient is sleeping , nurse went in room and He is resting with eyes closed, breathing pattern normal.

## 2016-05-07 NOTE — ED Notes (Signed)
Patient does not meet criteria for restraints. Patient released. Vitals obtained by this Clinical research associatewriter. Patient is currently resting in bed with eyes closed. Patient respirations are un labored. Will continue to monitor q15 min.

## 2016-05-07 NOTE — ED Notes (Signed)
Patient is sleeping, will continue to monitor. q 15 min. Checks, and camera surveillance in progress.

## 2016-05-07 NOTE — ED Notes (Addendum)
Patient laying in bed with 4 point restraints on. Patient has been informed on how to be released.
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
## 2016-05-07 NOTE — ED Notes (Signed)
Several officers assisted Patient back to room and He states that He would take po xanax verse im injection, Dr. Toni Amendlapacs ordered xanax and Patient did take without hesitation.

## 2016-05-07 NOTE — ED Notes (Signed)
Dr. Toni Amendlapacs talked with sister regarding the situation with chemical and physical restraints due to danger to self and others..Marland Kitchen

## 2016-05-07 NOTE — Progress Notes (Signed)
[facility]  Patient assigned to room 406 Bed 2 Accepting physician is Dr. Toni Amendlapacs.  Call report to [phone].  [place]  ER Staff is aware of it Misty Stanley([facility].; Dr. Scotty CourtStafford, ER MD & Toniann FailWendy Patient's Nurse)

## 2016-05-07 NOTE — Consult Note (Signed)
[facility] Consult   Reason for Consult:  Consult for 25 year old man brought in to the hospital under involuntary commitment with threatening and dangerous statements Referring Physician:  Quentin Cornwall Patient Identification: Philip French MRN:  [id number] Principal Diagnosis: Bipolar disorder, mixed [facility]) Diagnosis:   Patient Active Problem List   Diagnosis Date Noted  . Bipolar disorder, mixed ([employer]) [F31.60] 05/07/2016    Total Time spent with patient: 1 hour  Subjective:   Philip French is a 25 y.o. male patient admitted with "I haven't been feeling well".  HPI:  Patient interviewed. Chart reviewed. Spoke with his family briefly on the telephone. I have been here during the last hour and a half of interaction with the patient in the emergency room holding area. 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 On interview the patient says that his mood has been labile and up and down for the last couple months. He says that he had not slept at all for about 3 days and hasn't slept well for over a month. At times he has been depressed at other times he has been angry. He denies having hallucinations. He has been having some emotional problems and conflicts with professors at [employer]. Admits that he uses marijuana on a regular basis minimizes alcohol use. Had been taking prescribed medication from his psychiatrist in [place] but self discontinued it about a week ago. Patient states that his comments about "skinning her alive" a professor were meant metaphorically and not literally. He denies that he actually had any plan or intention to kill himself.  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 Family is from [place]. As of this time I believe both of his parents and his sister have all come down here to the area because of the  situation. Patient indicates he is not in any current romantic or sexual relationship.  Medical history: Intermittent headaches. Otherwise denies any significant medical problems  Substance abuse history: Patient denies alcohol abuse says he drinks only occasionally. Says he smokes marijuana on a fairly regular basis and finds therapeutic.  Past Psychiatric History: Patient reports that he's been treated for mental health issues since childhood. Currently has a psychiatrist in [place] who is familiar with him and has been prescribing for him. Has been seeing a therapist locally. He reports he's had 1 previous suicide attempt several months ago. Only had been diagnosed with bipolar disorder and was being treated with lamotrigine. Does not know of any other mood stabilizers that have been used. Admits to history of intermittent fighting and aggression at times  Risk to Self: Suicidal Ideation: Yes-Currently Present Suicidal Intent: No Is patient at risk for suicide?: No Suicidal Plan?: No (Pt denies intent or plan) Access to Means: No What has been your use of drugs/alcohol within the last 12 months?: Pt had reported access to a bag to suffocate himself How many times?: 0 Other Self Harm Risks: history of cutting Triggers for Past Attempts: None known Intentional Self Injurious Behavior: Cutting Comment - Self Injurious Behavior: Previous history of cutting Risk to Others: Homicidal Ideation: No Thoughts of Harm to Others: No Current Homicidal Intent: No Current Homicidal Plan: No Access to Homicidal Means: No Identified Victim: none identified History of harm to others?: No Assessment of Violence: None Noted Violent Behavior Description: none identified Does patient have access to weapons?: No  Criminal Charges Pending?: No Does patient have a court date: No Prior Inpatient Therapy: Prior Inpatient Therapy: No Prior Therapy Dates: n/a Prior Therapy Facilty/Provider(s):  n/a Reason for Treatment: n/a Prior Outpatient Therapy: Prior Outpatient Therapy: Yes Prior Therapy Dates: current Prior Therapy Facilty/Provider(s): [facility] Reason for Treatment: bipolar disorder Does patient have an ACCT team?: No Does patient have Intensive In-House Services?  : No Does patient have [facility] services? : No Does patient have P4[facility] services?: No  Past Medical History:  Past Medical History:  Diagnosis Date  . ADHD (attention deficit hyperactivity disorder)   . Anxiety   . Concussion   . Depression   . Kidney stone     Past Surgical History:  Procedure Laterality Date  . LITHOTRIPSY     Family History: No family history on file. Family Psychiatric  History: Patient reports there is a history of alcohol abuse and mood problems among several family members. Social History:  History  Alcohol Use  . Yes     History  Drug Use No    Social History   Social History  . Marital status: Single    Spouse name: N/A  . Number of children: N/A  . Years of education: N/A   Social History Main Topics  . Smoking status: Light Tobacco Smoker    Types: Cigarettes  . Smokeless tobacco: Never Used  . Alcohol use Yes  . Drug use: No  . Sexual activity: Not Asked   Other Topics Concern  . None   Social History Narrative  . None   Additional Social History:    Allergies:  No Known Allergies  Labs:  Results for orders placed or performed during the hospital encounter of 05/06/16 (from the past 48 hour(s))  Comprehensive metabolic panel     Status: Abnormal   Collection Time: 05/06/16  6:38 PM  Result Value Ref Range   Sodium 138 135 - 145 mmol/L   Potassium 3.8 3.5 - 5.1 mmol/L   Chloride 103 101 - 111 mmol/L   CO2 27 22 - 32 mmol/L   Glucose, Bld 93 65 - 99 mg/dL   BUN 18 6 - 20 mg/dL   Creatinine, Ser 1.10 0.61 - 1.24 mg/dL   Calcium 9.8 8.9 - 10.3 mg/dL   Total Protein 7.8 6.5 - 8.1 g/dL   Albumin 5.2 (H) 3.5 - 5.0 g/dL   AST 36 15 - 41 U/L    ALT 44 17 - 63 U/L   Alkaline Phosphatase 79 38 - 126 U/L   Total Bilirubin 0.9 0.3 - 1.2 mg/dL   GFR calc non Af Amer >60 >60 mL/min   GFR calc Af Amer >60 >60 mL/min    Comment: (NOTE) The eGFR has been calculated using the CKD EPI equation. This calculation has not been validated in all clinical situations. eGFR's persistently <60 mL/min signify possible Chronic Kidney Disease.    Anion gap 8 5 - 15  Ethanol     Status: None   Collection Time: 05/06/16  6:38 PM  Result Value Ref Range   Alcohol, Ethyl (B) <5 <5 mg/dL    Comment:        LOWEST DETECTABLE LIMIT FOR SERUM ALCOHOL IS 5 mg/dL FOR MEDICAL PURPOSES ONLY   Salicylate level     Status: None   Collection Time: 05/06/16  6:38 PM  Result Value Ref Range   Salicylate Lvl <3.8 2.8 - 30.0 mg/dL  Acetaminophen level     Status: Abnormal  Collection Time: 05/06/16  6:38 PM  Result Value Ref Range   Acetaminophen (Tylenol), Serum <10 (L) 10 - 30 ug/mL    Comment:        THERAPEUTIC CONCENTRATIONS VARY SIGNIFICANTLY. A RANGE OF 10-30 ug/mL MAY BE AN EFFECTIVE CONCENTRATION FOR MANY PATIENTS. HOWEVER, SOME ARE BEST TREATED AT CONCENTRATIONS OUTSIDE THIS RANGE. ACETAMINOPHEN CONCENTRATIONS >150 ug/mL AT 4 HOURS AFTER INGESTION AND >50 ug/mL AT 12 HOURS AFTER INGESTION ARE OFTEN ASSOCIATED WITH TOXIC REACTIONS.   cbc     Status: None   Collection Time: 05/06/16  6:38 PM  Result Value Ref Range   WBC 7.7 3.8 - 10.6 K/uL   RBC 5.07 4.40 - 5.90 MIL/uL   Hemoglobin 15.2 13.0 - 18.0 g/dL   HCT 45.0 40.0 - 52.0 %   MCV 88.7 80.0 - 100.0 fL   MCH 30.1 26.0 - 34.0 pg   MCHC 33.9 32.0 - 36.0 g/dL   RDW 13.0 11.5 - 14.5 %   Platelets 202 150 - 440 K/uL  Urine Drug Screen, Qualitative     Status: Abnormal   Collection Time: 05/06/16  6:38 PM  Result Value Ref Range   Tricyclic, Ur Screen NONE DETECTED NONE DETECTED   Amphetamines, Ur Screen NONE DETECTED NONE DETECTED   MDMA (Ecstasy)Ur Screen NONE DETECTED NONE  DETECTED   Cocaine Metabolite,Ur [place] NONE DETECTED NONE DETECTED   Opiate, Ur Screen NONE DETECTED NONE DETECTED   Phencyclidine (PCP) Ur S NONE DETECTED NONE DETECTED   Cannabinoid 50 Ng, Ur [place] POSITIVE (A) NONE DETECTED   Barbiturates, Ur Screen NONE DETECTED NONE DETECTED   Benzodiazepine, Ur Scrn NONE DETECTED NONE DETECTED   Methadone Scn, Ur NONE DETECTED NONE DETECTED    Comment: (NOTE) 403  Tricyclics, urine               Cutoff 1000 ng/mL 200  Amphetamines, urine             Cutoff 1000 ng/mL 300  MDMA (Ecstasy), urine           Cutoff 500 ng/mL 400  Cocaine Metabolite, urine       Cutoff 300 ng/mL 500  Opiate, urine                   Cutoff 300 ng/mL 600  Phencyclidine (PCP), urine      Cutoff 25 ng/mL 700  Cannabinoid, urine              Cutoff 50 ng/mL 800  Barbiturates, urine             Cutoff 200 ng/mL 900  Benzodiazepine, urine           Cutoff 200 ng/mL 1000 Methadone, urine                Cutoff 300 ng/mL 1100 1200 The urine drug screen provides only a preliminary, unconfirmed 1300 analytical test result and should not be used for non-medical 1400 purposes. Clinical consideration and professional judgment should 1500 be applied to any positive drug screen result due to possible 1600 interfering substances. A more specific alternate chemical method 1700 must be used in order to obtain a confirmed analytical result.  1800 Gas chromato graphy / mass spectrometry (GC/MS) is the preferred 1900 confirmatory method.     Current Facility-Administered Medications  Medication Dose Route Frequency Provider Last Rate Last Dose  . LORazepam (ATIVAN) 2 MG/ML injection           .  ziprasidone (GEODON) 20 MG injection            Current Outpatient Prescriptions  Medication Sig Dispense Refill  . naproxen (NAPROSYN) 500 MG tablet Take 1 tablet (500 mg total) by mouth 2 (two) times daily with a meal. 30 tablet 0  . traMADol (ULTRAM) 50 MG tablet Take 1 tablet (50 mg total) by  mouth every 6 (six) hours as needed. 12 tablet 0    Musculoskeletal: Strength & Muscle Tone: within normal limits Gait & Station: normal Patient leans: N/A  Psychiatric Specialty Exam: Physical Exam  Nursing note and vitals reviewed. Constitutional: He appears well-developed and well-nourished.  HENT:  Head: Normocephalic and atraumatic.  Eyes: Conjunctivae are normal. Pupils are equal, round, and reactive to light.  Neck: Normal range of motion.  Cardiovascular: Regular rhythm and normal heart sounds.   Respiratory: Effort normal. No respiratory distress.  GI: Soft.  Musculoskeletal: Normal range of motion.  Neurological: He is alert.  Skin: Skin is warm and dry.  Psychiatric: His affect is angry. His speech is tangential. He is agitated and aggressive. Cognition and memory are not impaired. He expresses impulsivity and inappropriate judgment. He expresses homicidal ideation.    Review of Systems  Constitutional: Negative.   HENT: Negative.   Eyes: Negative.   Respiratory: Negative.   Cardiovascular: Negative.   Gastrointestinal: Negative.   Musculoskeletal: Negative.   Skin: Negative.   Neurological: Negative.   Psychiatric/Behavioral: Positive for depression and suicidal ideas. Negative for hallucinations, memory loss and substance abuse. The patient is nervous/anxious and has insomnia.     Blood pressure (!) 105/51, pulse (!) 58, temperature 97.6 F (36.4 C), temperature source Oral, resp. rate 16, height _0  (1.753 m), weight 81.6 kg (180 lb), SpO2 99 %.Body mass index is 26.58 kg/m.  General Appearance: Casual  Eye Contact:  Fair  Speech:  Pressured  Volume:  Increased  Mood:  Angry and Irritable  Affect:  Inappropriate and Labile  Thought Process:  Disorganized  Orientation:  Full (Time, Place, and Person)  Thought Content:  Illogical, Paranoid Ideation and Tangential  Suicidal Thoughts:  Yes.  without intent/plan  Homicidal Thoughts:  Yes.  with intent/plan   Memory:  Immediate;   Good Recent;   Fair Remote;   Fair  Judgement:  Impaired  Insight:  Lacking  Psychomotor Activity:  Increased and Restlessness  Concentration:  Concentration: Poor  Recall:  Sunshine of Knowledge:  Good  Language:  Good  Akathisia:  No  Handed:  Right  AIMS (if indicated):     Assets:  Desire for Improvement Financial Resources/Insurance Housing Physical Health Resilience Social Support Vocational/Educational  ADL's:  Intact  Cognition:  WNL  Sleep:        Treatment Plan Summary: Daily contact with patient to assess and evaluate symptoms and progress in treatment, Medication management and Plan Patient is continuing today to exhibit labile behavior which has escalated after the interview. Patient has become violent and threatening and agitated. Has required restraint and forced medication because of violent threatening behavior. And had been for admission to the hospital either at our facility or transferred to [facility] behavioral health however at this point the patient is needing physical restraint. When necessary orders will be in place for medication. Also orders will be in place to begin Seroquel for bipolar disorder.  Disposition: Recommend psychiatric Inpatient admission when medically cleared. Supportive therapy provided about ongoing stressors.  Alethia Berthold, MD 05/07/2016 6:24 PM

## 2016-05-07 NOTE — ED Notes (Signed)
Patient lying in bed on stomach, pillows over his head, nurse checked on him, respirations even and unlabored, but patient will not respond to verbal stimuli, nurse obtained v/s, vs/ stable, patient did finally grunt. Patient with q 15 min. Checks and camera monitoring in progress.

## 2016-05-07 NOTE — ED Notes (Signed)
Dr. Clapacs in with Patient.  

## 2016-05-07 NOTE — ED Notes (Signed)
Nurse reported to oncoming nurse.

## 2016-05-08 NOTE — ED Notes (Signed)

## 2016-05-08 NOTE — ED Notes (Signed)
Patient asleep in room. No noted distress or abnormal behavior. Will continue 15 minute checks and observation by security cameras for safety. 

## 2016-05-08 NOTE — ED Notes (Signed)
During safety check, RN noted that patient had thrown food all over room and then changed to another bed. He did not answer when nurse called his name loudly. Patient is in no apparent physical distress, respirations even. Will continue to closely monitor.

## 2016-05-08 NOTE — ED Notes (Signed)

## 2016-05-08 NOTE — ED Notes (Signed)
Patient is Philip French, still pending placement R.Lanora ManisMoore R.M.A

## 2016-05-08 NOTE — BH Assessment (Signed)
[facility] Assessment Progress Note  [facility] in the intake department on 05/08/13. [employer] tracking # [id number] to follow up daily on patient status for transfer to facility.

## 2016-05-08 NOTE — ED Notes (Signed)
ED BHU PLACEMENT JUSTIFICATION Is the patient under IVC or is there intent for IVC: Yes.   Is the patient medically cleared: Yes.   Is there vacancy in the ED BHU: Yes.   Is the population mix appropriate for patient: Yes.   Is the patient awaiting placement in inpatient or outpatient setting: Yes.   Has the patient had a psychiatric consult: Yes.   Survey of unit performed for contraband, proper placement and condition of furniture, tampering with fixtures in bathroom, shower, and each patient room: Yes.  ; Findings: NA APPEARANCE/BEHAVIOR calm NEURO ASSESSMENT Orientation: time, place and person Hallucinations: No.None noted (Hallucinations) Speech: Normal Gait: normal RESPIRATORY ASSESSMENT Normal expansion.  Clear to auscultation.  No rales, rhonchi, or wheezing. CARDIOVASCULAR ASSESSMENT regular rate and rhythm, S1, S2 normal, no murmur, click, rub or gallop GASTROINTESTINAL ASSESSMENT soft, nontender, BS WNL, no r/g EXTREMITIES normal strength, tone, and muscle mass PLAN OF CARE Provide calm/safe environment. Vital signs assessed twice daily. ED BHU Assessment once each 12-hour shift. Collaborate with intake RN daily or as condition indicates. Assure the ED provider has rounded once each shift. Provide and encourage hygiene. Provide redirection as needed. Assess for escalating behavior; address immediately and inform ED provider.  Assess family dynamic and appropriateness for visitation as needed: Yes.  ; If necessary, describe findings: NA Educate the patient/family about BHU procedures/visitation: Yes.  ; If necessary, describe findings: NA

## 2016-05-08 NOTE — ED Notes (Signed)

## 2016-05-08 NOTE — ED Notes (Signed)
Patient awake and alert. Agreed to talk with nurse. Mood continues to be very labile but responding to firm verbal limits. Patient is angry with school, family, health care providers. Not taking any responsibility for his behaviors which resulted in hospitalization and then restraints.  Patient agreed to take oral medications after explanation by nurse. Patient was given clean scrubs and hygiene items. He remains restricted to the area closest to the nursing station to allow for close monitoring. Continues on all safety precautions.

## 2016-05-08 NOTE — ED Notes (Signed)
Patient awake. He is calm and cooperative. Patient was told parents called . Patient ate his meal.

## 2016-05-08 NOTE — ED Notes (Signed)
[facility]  [facility]  Pt  Became  Angry  Will  Not  Go  Now  Per  Dr Toni Amendlapacs  . CAN  TRY  LATER TONIGHT  PER  DR CLAPACS

## 2016-05-08 NOTE — ED Notes (Signed)
ED MD advised this writer not to give patient HS medication to let patient sleep. If patient wakes up give medication at that time. Patient is currently resting with eyes closed. Patient respirations are even and un-labored. Will continue to monitor Q15 min.

## 2016-05-08 NOTE — ED Notes (Signed)
Patient asleep in room. No noted distress or abnormal behavior. Will continue 15 minute checks and observation by security cameras for safety. Breakfast tray left at bedside. 

## 2016-05-08 NOTE — ED Notes (Signed)
Patient's mother called and was able to give code. Patient's mother was asking how patient was doing. This Clinical research associatewriter explained patient was still sleeping at this time. Patient had got up once and then went back to bed. We are monitoring patient continually VIA cameras and Q15 min checks.

## 2016-05-08 NOTE — ED Notes (Signed)
Pt. Alert and oriented, warm and dry, in no distress. Pt. Denies SI, HI, and AVH. Pt. Encouraged to let nursing staff know of any concerns or needs. Patient cooperative with no issues. Patient requested to be able to walk around unit. Patient is currently walking unit.

## 2016-05-08 NOTE — ED Notes (Signed)
Patient asleep in room. No noted distress or abnormal behavior. Will continue 15 minute checks and observation by security cameras for safety. Supper tray left at bedside. Parents called to check on patient status.

## 2016-05-08 NOTE — ED Provider Notes (Signed)
-----------------------------------------   7:46 AM on 05/08/2016 -----------------------------------------   Blood pressure 113/64, pulse 87, temperature 97.6 F (36.4 C), temperature source Oral, resp. rate 16, height 5\' 9"  (1.753 m), weight 180 lb (81.6 kg), SpO2 96 %.  The patient had no acute events since last update.  Calm and cooperative at this time.  Disposition is pending Psychiatry/Behavioral Medicine team recommendations.     Willy EddyPatrick Rinoa Garramone, MD 05/08/16 [phone]

## 2016-05-09 MED ORDER — ONDANSETRON 8 MG PO TBDP
4.0000 mg | ORAL_TABLET | Freq: Once | ORAL | Status: AC
  Administered 2016-05-09: 4 mg via ORAL
  Filled 2016-05-09 (×2): qty 0.5

## 2016-05-09 NOTE — ED Notes (Signed)
Patient asleep in room. No noted distress or abnormal behavior. Will continue 15 minute checks and observation by security cameras for safety. 

## 2016-05-09 NOTE — ED Notes (Signed)
Patient awake, calmer. Received supper tray. Maintained on 15 minute checks and observation by security camera for safety.

## 2016-05-09 NOTE — ED Notes (Signed)
Patient asleep. Meal tray left at bedside. 

## 2016-05-09 NOTE — ED Notes (Signed)

## 2016-05-09 NOTE — ED Notes (Signed)

## 2016-05-09 NOTE — ED Notes (Signed)
IVC/Pending Placement 

## 2016-05-09 NOTE — ED Notes (Signed)
Patient out in common area. Hyper, rapid pressured speech. Does respond to verbal limits. Social with select peers. Maintained on 15 minute checks and observation by security camera for safety.

## 2016-05-09 NOTE — ED Notes (Signed)
Patient told this writer, "I feel like I am a failure. I can't do anything right." This Clinical research associatewriter gave words of encouragement and asked patient, "Why do you feel that way?" Patient states, "I just came out to my parents as being guy and they did not take it well. I feel like I just can't do anything right. I have disappointed them."  This writer advised patient that he is not a failure that he has just had a bump in the road and that he will get over this hump. That he needs to try and think positive and use some coping skills when he is feeling down.

## 2016-05-09 NOTE — ED Notes (Signed)
Patient went to bathroom after medications given and vomited x 1 in toilet. Patient requesting more medications. This nurse advised patient at this time I could not give any more medications at this time.

## 2016-05-09 NOTE — ED Notes (Signed)
Patient called his sister.

## 2016-05-09 NOTE — ED Notes (Signed)
MD made aware of patient vomiting. Order received for Zofran ODT 4mg  one time dose. Patient is currently in bed resting watching TV.

## 2016-05-09 NOTE — ED Notes (Signed)
Patient's mother called again and patient was informed. He currently does not want to talk with her.

## 2016-05-09 NOTE — ED Provider Notes (Signed)
-----------------------------------------   6:01 AM on 05/09/2016 -----------------------------------------   Blood pressure 108/62, pulse 63, temperature 98.5 F (36.9 C), temperature source Oral, resp. rate 16, height 5\' 9"  (1.753 m), weight 180 lb (81.6 kg), SpO2 98 %.  The patient had no acute events since last update.  Calm and cooperative at this time.  Disposition is pending Psychiatry/Behavioral Medicine team recommendations.     Myrna Blazeravid Matthew Mackinze Criado, MD 05/09/16 [phone]

## 2016-05-09 NOTE — ED Notes (Signed)
Pt. Alert and oriented, warm and dry, in no distress. Pt. Denies SI, HI, and AVH. Pt. Encouraged to let nursing staff know of any concerns or needs. Patient became tearful during assessment. Patient states, "I just get upset and feel like everything is going bad. How long am I going to be here?" This Clinical research associatewriter told patient I did not know that would be up to the doctor. Patient states he is afraid of the doctor because of his actions Friday. This Clinical research associatewriter advised patient not to worry the doctor is not going to hold anything against him and that he is doing better than he was the other day.

## 2016-05-09 NOTE — ED Notes (Signed)
Patient called his mother.

## 2016-05-09 NOTE — BH Assessment (Signed)
Confirmed with [facility] ([phone]), patient is remain their waitlist. Nothing else needed at this time.

## 2016-05-09 NOTE — ED Notes (Signed)
Patient continuing to have difficulty. Very upset, started pacing again. Punching pillow at nurses suggestion. Asking for medication to help calm him down. Patient agreed to take PRN medication via IM for more rapid relief of symptoms.

## 2016-05-09 NOTE — ED Notes (Signed)
Patient awake, alert, and oriented. Able to sit and hold a conversation with nurse but still with rapid pressured speech and racing thoughts. Cooperative with taking medications.  Patient was unwilling to talk with his mother when she called. She left her phone number #[phone]. Will encourage patient to call when he feels able. Maintained on 15 minute checks and observation by security camera for safety.

## 2016-05-09 NOTE — ED Notes (Signed)

## 2016-05-09 NOTE — ED Notes (Signed)
Patient became very upset after telephone calls with family. Started to pace unit, crying. Agreed to take PRN medication to assist with behavioral control. Will continue to monitor.

## 2016-05-09 NOTE — ED Notes (Signed)
Patient asleep in room. No noted distress or abnormal behavior. Will continue 15 minute checks and observation by security cameras for safety. Breakfast tray left at bedside. 

## 2016-05-10 ENCOUNTER — Inpatient Hospital Stay
Admission: EM | Admit: 2016-05-10 | Discharge: 2016-05-16 | DRG: 885 | Disposition: A | Discharge status: Home / Self Care | Payer: 59 | Source: Intra-hospital | Attending: Psychiatry | Admitting: Psychiatry

## 2016-05-10 ENCOUNTER — Encounter: Payer: Self-pay | Admitting: Psychiatry

## 2016-05-10 DIAGNOSIS — R45851 Suicidal ideations: Secondary | ICD-10-CM | POA: Diagnosis present

## 2016-05-10 DIAGNOSIS — F17290 Nicotine dependence, other tobacco product, uncomplicated: Secondary | ICD-10-CM | POA: Diagnosis present

## 2016-05-10 DIAGNOSIS — G4700 Insomnia, unspecified: Secondary | ICD-10-CM | POA: Diagnosis present

## 2016-05-10 DIAGNOSIS — F909 Attention-deficit hyperactivity disorder, unspecified type: Secondary | ICD-10-CM | POA: Diagnosis present

## 2016-05-10 DIAGNOSIS — F3160 Bipolar disorder, current episode mixed, unspecified: Secondary | ICD-10-CM | POA: Diagnosis not present

## 2016-05-10 DIAGNOSIS — Z046 Encounter for general psychiatric examination, requested by authority: Secondary | ICD-10-CM

## 2016-05-10 DIAGNOSIS — Z87442 Personal history of urinary calculi: Secondary | ICD-10-CM | POA: Diagnosis not present

## 2016-05-10 DIAGNOSIS — F4310 Post-traumatic stress disorder, unspecified: Secondary | ICD-10-CM | POA: Diagnosis present

## 2016-05-10 DIAGNOSIS — Z781 Physical restraint status: Secondary | ICD-10-CM | POA: Diagnosis not present

## 2016-05-10 DIAGNOSIS — F1220 Cannabis dependence, uncomplicated: Secondary | ICD-10-CM | POA: Diagnosis present

## 2016-05-10 DIAGNOSIS — R45850 Homicidal ideations: Secondary | ICD-10-CM | POA: Diagnosis present

## 2016-05-10 DIAGNOSIS — F3181 Bipolar II disorder: Secondary | ICD-10-CM | POA: Diagnosis present

## 2016-05-10 DIAGNOSIS — F3164 Bipolar disorder, current episode mixed, severe, with psychotic features: Principal | ICD-10-CM | POA: Diagnosis present

## 2016-05-10 DIAGNOSIS — F419 Anxiety disorder, unspecified: Secondary | ICD-10-CM | POA: Diagnosis present

## 2016-05-10 DIAGNOSIS — Z915 Personal history of self-harm: Secondary | ICD-10-CM | POA: Diagnosis not present

## 2016-05-10 DIAGNOSIS — Z9119 Patient's noncompliance with other medical treatment and regimen: Secondary | ICD-10-CM

## 2016-05-10 DIAGNOSIS — F17200 Nicotine dependence, unspecified, uncomplicated: Secondary | ICD-10-CM | POA: Diagnosis present

## 2016-05-10 MED ORDER — PRAZOSIN HCL 2 MG PO CAPS
2.0000 mg | ORAL_CAPSULE | Freq: Two times a day (BID) | ORAL | Status: DC
  Administered 2016-05-10 – 2016-05-12 (×4): 2 mg via ORAL
  Filled 2016-05-10 (×5): qty 1

## 2016-05-10 MED ORDER — MAGNESIUM HYDROXIDE 400 MG/5ML PO SUSP
30.0000 mL | Freq: Every day | ORAL | Status: DC | PRN
Start: 2016-05-10 — End: 2016-05-16

## 2016-05-10 MED ORDER — TEMAZEPAM 15 MG PO CAPS
15.0000 mg | ORAL_CAPSULE | Freq: Every day | ORAL | Status: DC
  Administered 2016-05-10 – 2016-05-15 (×6): 15 mg via ORAL
  Filled 2016-05-10 (×6): qty 1

## 2016-05-10 MED ORDER — LAMOTRIGINE 25 MG PO TABS
25.0000 mg | ORAL_TABLET | Freq: Every day | ORAL | Status: DC
  Administered 2016-05-10 – 2016-05-15 (×6): 25 mg via ORAL
  Filled 2016-05-10 (×6): qty 1

## 2016-05-10 MED ORDER — TRAZODONE HCL 50 MG PO TABS: 50.0000 mg | ORAL_TABLET | Freq: Every evening | ORAL | Status: DC | PRN

## 2016-05-10 MED ORDER — ACETAMINOPHEN 325 MG PO TABS: 650.0000 mg | ORAL_TABLET | Freq: Four times a day (QID) | ORAL | Status: DC | PRN

## 2016-05-10 MED ORDER — ALUM & MAG HYDROXIDE-SIMETH 200-200-20 MG/5ML PO SUSP: 30.0000 mL | ORAL | Status: DC | PRN

## 2016-05-10 MED ORDER — QUETIAPINE FUMARATE 100 MG PO TABS
100.0000 mg | ORAL_TABLET | Freq: Two times a day (BID) | ORAL | Status: DC
  Administered 2016-05-10 – 2016-05-12 (×4): 100 mg via ORAL
  Filled 2016-05-10 (×5): qty 1

## 2016-05-10 NOTE — [facility] Group Notes (Signed)
[facility] Group Notes:  (Nursing/MHT/Case Management/Adjunct)  Date:  05/10/2016  Time:  10:31 PM  Type of Therapy:  Group Therapy  Participation Level:  Active  Participation Quality:  Appropriate  Affect:  Appropriate  Cognitive:  Appropriate  Insight:  Appropriate  Engagement in Group:  Engaged  Modes of Intervention:  Discussion  Summary of Progress/Problems:  Philip EkJanice Marie [name] 05/10/2016, 10:31 PM

## 2016-05-10 NOTE — ED Notes (Signed)
Patient anxious this am. Encouraged patient to practice breathing techniques to calm self. Pt receptive,walking on unit, reports it helps calm him down. Ativan given

## 2016-05-10 NOTE — ED Notes (Signed)
Patient's mother called and gave able to give code. Mother wanting to know how patient was last night. This Clinical research associatewriter stated patient is doing good, and he was able to control behaviors. With few tearful outburst. Phone times was given to patient mother.

## 2016-05-10 NOTE — ED Notes (Signed)
MD at bedside. 

## 2016-05-10 NOTE — Tx Team (Signed)
Initial Treatment Plan 05/10/2016 2:10 PM Philip French [id number]    PATIENT STRESSORS: Educational concerns Marital or family conflict   PATIENT STRENGTHS: Active sense of humor Communication skills Financial means   PATIENT IDENTIFIED PROBLEMS: Bipolar Disorder I                     DISCHARGE CRITERIA:  Improved stabilization in mood, thinking, and/or behavior Medical problems require only outpatient monitoring Need for constant or close observation no longer present  PRELIMINARY DISCHARGE PLAN: Outpatient therapy  PATIENT/FAMILY INVOLVEMENT: This treatment plan has been presented to and reviewed with the patient, Philip French, and/or family member.  The patient and family have been given the opportunity to ask questions and make suggestions.  [name], RN 05/10/2016, 2:10 PM

## 2016-05-10 NOTE — Consult Note (Signed)
[facility] Face-to-Face Psychiatry Consult   Reason for Consult:  Consult for 25 year old man brought in to the hospital under involuntary commitment with threatening and dangerous statements Referring Physician:  Roxan Hockey Patient Identification: SHUAN STATZER MRN:  [id number] Principal Diagnosis: Bipolar disorder, mixed [facility]) Diagnosis:   Patient Active Problem List   Diagnosis Date Noted  . Bipolar disorder, mixed (HCC) [F31.60] 05/07/2016    Total Time spent with patient: 20 minutes  Subjective:   ALIZE ACY is a 25 y.o. male patient admitted with "I haven't been feeling well".  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 Patient interviewed. Chart reviewed. Patient came into the hospital with very agitated chaotic mood states which descended into aggression and agitation in the emergency room. Since Friday he has been receiving medication for bipolar disorder. He has not had any further episodes of requiring restraint or aggressive interventions since Friday. He has been cooperative with and taking appropriate medication orally. On interview today the patient says he is no longer having any feelings or thoughts of doing anything violent or hurting anyone. He is not reporting acute suicidal thoughts. Remains somewhat disorganized and appears depressed and withdrawn. Because of the severity of his recent illness, newness of treatment, lack of immediate outpatient treatment, high risk for dangerousness patient continues to meet commitment criteria and requires stabilization in the inpatient unit.     HPI:  Patient interviewed. Chart reviewed. Spoke with his family briefly on the telephone. I have been here during the last hour and a half of interaction with the patient in the emergency room holding area. 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 On interview the patient says that his mood has been labile and up and down for the last couple months. He says that he had not slept at all for about 3 days and hasn't slept well for over a month. At times he has been depressed at other times he has been angry. He denies having hallucinations. He has been having some emotional problems and conflicts with professors at [employer]. Admits that he uses marijuana on a regular basis minimizes alcohol use. Had been taking prescribed medication from his psychiatrist in [place] but self discontinued it about a week ago. Patient states that his comments about "skinning her alive" a professor were meant metaphorically and not literally. He denies that he actually had any plan or intention to kill himself.  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 Family is from [place]. As of this time I believe both of his parents and his sister have all come down here to the area because of the situation. Patient indicates he is not in any current romantic or sexual relationship.  Medical history: Intermittent headaches. Otherwise denies any significant medical problems  Substance abuse history: Patient denies alcohol abuse says he drinks only occasionally. Says he smokes marijuana on a fairly regular basis and finds therapeutic.  Past Psychiatric History: Patient reports that he's been treated for mental health issues since childhood. Currently has a psychiatrist in [place] who is familiar with him and has been prescribing for him. Has been seeing a therapist locally. He reports he's had 1 previous suicide attempt several months ago. Only had been diagnosed with bipolar disorder and was being treated with lamotrigine. Does not know of any other  mood stabilizers that have been used. Admits to history of intermittent fighting and aggression at times  Risk to Self: Suicidal Ideation: Yes-Currently  Present Suicidal Intent: No Is patient at risk for suicide?: No Suicidal Plan?: No (Pt denies intent or plan) Access to Means: No What has been your use of drugs/alcohol within the last 12 months?: Pt had reported access to a bag to suffocate himself How many times?: 0 Other Self Harm Risks: history of cutting Triggers for Past Attempts: None known Intentional Self Injurious Behavior: Cutting Comment - Self Injurious Behavior: Previous history of cutting Risk to Others: Homicidal Ideation: No Thoughts of Harm to Others: No Current Homicidal Intent: No Current Homicidal Plan: No Access to Homicidal Means: No Identified Victim: none identified History of harm to others?: No Assessment of Violence: None Noted Violent Behavior Description: none identified Does patient have access to weapons?: No Criminal Charges Pending?: No Does patient have a court date: No Prior Inpatient Therapy: Prior Inpatient Therapy: No Prior Therapy Dates: n/a Prior Therapy Facilty/Provider(s): n/a Reason for Treatment: n/a Prior Outpatient Therapy: Prior Outpatient Therapy: Yes Prior Therapy Dates: current Prior Therapy Facilty/Provider(s): [facility] Reason for Treatment: bipolar disorder Does patient have an ACCT team?: No Does patient have Intensive In-House Services?  : No Does patient have [facility] services? : No Does patient have P4CC services?: No  Past Medical History:  Past Medical History:  Diagnosis Date  . ADHD (attention deficit hyperactivity disorder)   . Anxiety   . Concussion   . Depression   . Kidney stone     Past Surgical History:  Procedure Laterality Date  . LITHOTRIPSY     Family History: No family history on file. Family Psychiatric  History: Patient reports there is a history of alcohol abuse and mood problems among several family members. Social History:  History  Alcohol Use  . Yes     History  Drug Use No    Social History   Social History  . Marital  status: Single    Spouse name: N/A  . Number of children: N/A  . Years of education: N/A   Social History Main Topics  . Smoking status: Light Tobacco Smoker    Types: Cigarettes  . Smokeless tobacco: Never Used  . Alcohol use Yes  . Drug use: No  . Sexual activity: Not Asked   Other Topics Concern  . None   Social History Narrative  . None   Additional Social History:    Allergies:  No Known Allergies  Labs:  No results found for this or any previous visit (from the past 48 hour(s)).  Current Facility-Administered Medications  Medication Dose Route Frequency Provider Last Rate Last Dose  . LORazepam (ATIVAN) injection 2 mg  2 mg Intravenous Q4H PRN Audery Amel, MD      . LORazepam (ATIVAN) tablet 2 mg  2 mg Oral Q4H PRN Audery Amel, MD   2 mg at 05/10/16 0731  . QUEtiapine (SEROQUEL) tablet 100 mg  100 mg Oral BID Audery Amel, MD   100 mg at 05/10/16 0952  . ziprasidone (GEODON) injection 20 mg  20 mg Intramuscular Q12H PRN Audery Amel, MD   20 mg at 05/09/16 1157   Current Outpatient Prescriptions  Medication Sig Dispense Refill  . naproxen (NAPROSYN) 500 MG tablet Take 1 tablet (500 mg total) by mouth 2 (two) times daily with a meal. 30 tablet 0  . traMADol (ULTRAM) 50 MG tablet Take 1  tablet (50 mg total) by mouth every 6 (six) hours as needed. 12 tablet 0    Musculoskeletal: Strength & Muscle Tone: within normal limits Gait & Station: normal Patient leans: N/A  Psychiatric Specialty Exam: Physical Exam  Nursing note and vitals reviewed. Constitutional: He appears well-developed and well-nourished.  HENT:  Head: Normocephalic and atraumatic.  Eyes: Conjunctivae are normal. Pupils are equal, round, and reactive to light.  Neck: Normal range of motion.  Cardiovascular: Regular rhythm and normal heart sounds.   Respiratory: Effort normal. No respiratory distress.  GI: Soft.  Musculoskeletal: Normal range of motion.  Neurological: He is alert.   Skin: Skin is warm and dry.  Psychiatric: His affect is blunt. His affect is not angry. His speech is tangential. He is slowed. He is not agitated and not aggressive. Cognition and memory are not impaired. He expresses impulsivity and inappropriate judgment. He expresses no homicidal ideation.    Review of Systems  Constitutional: Negative.   HENT: Negative.   Eyes: Negative.   Respiratory: Negative.   Cardiovascular: Negative.   Gastrointestinal: Negative.   Musculoskeletal: Negative.   Skin: Negative.   Neurological: Negative.   Psychiatric/Behavioral: Positive for depression. Negative for hallucinations, memory loss, substance abuse and suicidal ideas. The patient has insomnia. The patient is not nervous/anxious.     Blood pressure 118/61, pulse 64, temperature 97.8 F (36.6 C), temperature source Oral, resp. rate 16, height 5\' 9"  (1.753 m), weight 81.6 kg (180 lb), SpO2 96 %.Body mass index is 26.58 kg/m.  General Appearance: Casual  Eye Contact:  Fair  Speech:  Pressured  Volume:  Increased  Mood:  Dysphoric  Affect:  Flat  Thought Process:  Disorganized  Orientation:  Full (Time, Place, and Person)  Thought Content:  Tangential  Suicidal Thoughts:  Yes.  without intent/plan  Homicidal Thoughts:  No  Memory:  Immediate;   Good Recent;   Fair Remote;   Fair  Judgement:  Impaired  Insight:  Lacking  Psychomotor Activity:  Normal  Concentration:  Concentration: Poor  Recall:  Fair  Fund of Knowledge:  Good  Language:  Good  Akathisia:  No  Handed:  Right  AIMS (if indicated):     Assets:  Desire for Improvement Financial Resources/Insurance Housing Physical Health Resilience Social Support Vocational/Educational  ADL's:  Intact  Cognition:  WNL  Sleep:        Treatment Plan Summary: Daily contact with patient to assess and evaluate symptoms and progress in treatment, Medication management and Plan Patient has calm down over the weekend. Patient still has  significant mood symptoms. Only 2 days out from violent behavior. However at this point his behavior is consistent with admission safely to the inpatient unit. Case reviewed with TTS. Recommend admission to the inpatient unit preferably at our facility as soon as possible. Patient is informed of the plan and is accepting of it without being argumentative. Continue current medicine as prescribed. I will make sure the orders are prepared.continue ivc  Disposition: Recommend psychiatric Inpatient admission when medically cleared. Supportive therapy provided about ongoing stressors.  Mordecai RasmussenJohn Aloura Matsuoka, MD 05/10/2016 10:43 AM

## 2016-05-10 NOTE — ED Provider Notes (Signed)
-----------------------------------------   7:20 AM on 05/10/2016 -----------------------------------------   Blood pressure 118/61, pulse 64, temperature 97.8 F (36.6 C), temperature source Oral, resp. rate 16, height 5\' 9"  (1.753 m), weight 180 lb (81.6 kg), SpO2 96 %.  The patient had no acute events since last update.  Calm and cooperative at this time.  Disposition is pending Psychiatry/Behavioral Medicine team recommendations.     Jeanmarie PlantJames A McShane, MD 05/10/16 [phone]

## 2016-05-10 NOTE — Progress Notes (Signed)
Spoke with pt. This a.m. For re-evaluation/ assessment. Patient denies current SI/HI and AVH. Patient acknowledges that he may need commitment and is in need of medication management as depressive nature of bipolar has gotten worse, and social support network has recently diminished as friends and network for past support have moved on from college and communication is little to no with friend support network. Case discussed with Dr. Toni Amendlapacs to see pt.  Dwyne Hasegawa K. Sherlon HandingHarris, LCAS-A, LPC-A, [facility]  Counselor 05/10/2016 10:41 AM

## 2016-05-10 NOTE — ED Notes (Signed)
Patient calm and cooperative thus far. Prn given with good relief. Pt reporting feeling anxious due to being in small place. Pt reports needing more room , has some mild claustrophobia.

## 2016-05-10 NOTE — [facility] Suicide Risk Assessment (Addendum)
[facility] Admission Suicide Risk Assessment   Nursing information obtained from:    Demographic factors:    Current Mental Status:    Loss Factors:    Historical Factors:    Risk Reduction Factors:     Total Time spent with patient: 1 hour Principal Problem: Bipolar affective disorder, mixed, severe, with psychotic behavior (HCC) Diagnosis:   Patient Active Problem List   Diagnosis Date Noted  . Cannabis use disorder, moderate, dependence (HCC) [F12.20] 05/10/2016  . Involuntary commitment [Z04.6] 05/10/2016  . Tobacco use disorder [F17.200] 05/10/2016  . Suicidal ideation [R45.851] 05/10/2016  . Bipolar affective disorder, mixed, severe, with psychotic behavior (HCC) [F31.64] 05/07/2016   Subjective Data: suicidal and homicidal ideation, agitation, self injurious behavior.  Continued Clinical Symptoms:    The "Alcohol Use Disorders Identification Test", Guidelines for Use in Primary Care, Second Edition.  World Science writer [facility]). Score between 0-7:  no or low risk or alcohol related problems. Score between 8-15:  moderate risk of alcohol related problems. Score between 16-19:  high risk of alcohol related problems. Score 20 or above:  warrants further diagnostic evaluation for alcohol dependence and treatment.   CLINICAL FACTORS:   Bipolar Disorder:   Mixed State Alcohol/Substance Abuse/Dependencies   Musculoskeletal: Strength & Muscle Tone: within normal limits Gait & Station: normal Patient leans: N/A  Psychiatric Specialty Exam: Physical Exam  Nursing note and vitals reviewed. Constitutional: He is oriented to person, place, and time. He appears well-developed and well-nourished.  HENT:  Head: Normocephalic and atraumatic.  Eyes: Conjunctivae and EOM are normal. Pupils are equal, round, and reactive to light.  Neck: Normal range of motion. Neck supple.  Cardiovascular: Normal rate, regular rhythm and normal heart sounds.   Respiratory: Effort normal and breath  sounds normal.  GI: Soft. Bowel sounds are normal.  Musculoskeletal: Normal range of motion.  Neurological: He is alert and oriented to person, place, and time.  Skin: Skin is warm and dry.    Review of Systems  Psychiatric/Behavioral: Positive for substance abuse and suicidal ideas. The patient is nervous/anxious.   All other systems reviewed and are negative.   Blood pressure 113/66, pulse 62, temperature 98.6 F (37 C), temperature source Oral, resp. rate 16, height 5\' 9"  (1.753 m), weight 81.2 kg (179 lb), SpO2 100 %.Body mass index is 26.43 kg/m.  General Appearance: Casual  Eye Contact:  Good  Speech:  Clear and Coherent  Volume:  Normal  Mood:  Anxious  Affect:  Appropriate  Thought Process:  Goal Directed  Orientation:  Full (Time, Place, and Person)  Thought Content:  WDL  Suicidal Thoughts:  No  Homicidal Thoughts:  No  Memory:  Immediate;   Fair Recent;   Fair Remote;   Fair  Judgement:  Poor  Insight:  Lacking  Psychomotor Activity:  Normal  Concentration:  Concentration: Fair and Attention Span: Fair  Recall:  [employer] of Knowledge:  Fair  Language:  Fair  Akathisia:  No  Handed:  Right  AIMS (if indicated):     Assets:  Communication Skills Desire for Improvement Financial Resources/Insurance Housing Physical Health Resilience Social Support Vocational/Educational  ADL's:  Intact  Cognition:  WNL  Sleep:         COGNITIVE FEATURES THAT CONTRIBUTE TO RISK:  None    SUICIDE RISK:   Moderate:  Frequent suicidal ideation with limited intensity, and duration, some specificity in terms of plans, no associated intent, good self-control, limited dysphoria/symptomatology, some risk factors present,  and identifiable protective factors, including available and accessible social support.   PLAN OF CARE: Hospital admission, medication management, substance abuse counseling, discharge planning.  Mr. Gar GibbonManuelli is an [age] year old male with a history of  bipolar disorder admitted for suicidal and homicidal ideation the context of treatment noncompliance and severe academic and social stressors.  1. Suicidal and homicidal ideation.  the patient is able to contract for safety in the hospital.  2. Agitation. This has resolved.   3. Mood. We will continue Seroquel that was initiated in the emergency room and restart Lamictal for mood stabilization.  4. PTSD. Will start Minipress twice daily for nightmares and flashbacks.  5. Marijuana use. The patient minimizes his problems and declines treatment.  6. Smoking. Nicotine patch is available.  7. ADHD. Will not last restart Vyvanse at present.  8. Anxiety. The patient is asking for Xanax will offer Vistaril.  9. Insomnia. We will give Restoril in addition to Seroquel.  10. Disposition. He will be discharged with his family. He will follow up with [place] psychiatrist in [place].  I certify that inpatient services furnished can reasonably be expected to improve the patient's condition.  Kristine LineaJolanta Jaheim Canino, MD 05/10/2016, 1:59 PM

## 2016-05-10 NOTE — Progress Notes (Signed)
D:  Patient denies SI/AVH/HI.  Patient acclimating to the unit well.  Patient attending group sessions. A:  Patient offered support and encouragement. R:  Patient safety maintained with 15 minute checks.

## 2016-05-10 NOTE — Progress Notes (Signed)
Patient arrived to the unit at approximately 1330 PM.  Patient arrived in scrubs, gait steady and body odor present.  Patient denies SI/AVH/HI.  Patient skin searched with second staff present and no issues were found.  Patient belongings searched and logged by staff.  Patient oriented to the unit and to his room.
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
## 2016-05-10 NOTE — ED Notes (Signed)
Patient asleep in room. No apparent distress. Will continue with 15 min checks and  observation by security checks and cameras for safety.

## 2016-05-10 NOTE — ED Notes (Signed)
Pt admitted to inpatient behavioral health. Stable condition. No complaints

## 2016-05-10 NOTE — Progress Notes (Signed)
[facility] by Dr. Toni Amendlapacs.  Attending Physician will be Dr. Jennet MaduroPucilowska.   Patient [facility] Nurse [place].   Intake Paper Work has been signed and placed on patient chart.  ER staff is aware of the admission Misty Stanley( Lisa, ER Sect.; Dr. Alphonzo LemmingsMcShane, ER MD; Marylu LundJanet, Patient's Nurse &  Patient Access). Hardie Veltre K. Sherlon HandingHarris, LCAS-A, LPC-A, [facility]  Counselor 05/10/2016 12:46 PM

## 2016-05-10 NOTE — Plan of Care (Signed)
Problem: Coping: Goal: Ability to interact with others will improve Outcome: Progressing Pt interacts with pts on the unit. He requires frequent reminder of no touching policy.  Problem: Coping: Goal: Ability to verbalize frustrations and anger appropriately will improve Outcome: Progressing Pt expresses feelings leading up to depression and anxiety. He is able to identify his major stressors.

## 2016-05-11 MED ORDER — PROMETHAZINE HCL 25 MG PO TABS
25.0000 mg | ORAL_TABLET | Freq: Four times a day (QID) | ORAL | Status: DC | PRN
  Administered 2016-05-11: 25 mg via ORAL
  Filled 2016-05-11: qty 1

## 2016-05-11 MED ORDER — HYDROXYZINE HCL 25 MG PO TABS: 25.0000 mg | ORAL_TABLET | Freq: Three times a day (TID) | ORAL | Status: DC | PRN

## 2016-05-11 NOTE — [facility] Group Notes (Signed)
[facility] LCSW Group Therapy   05/11/2016 1pm  Type of Therapy: Group Therapy   Participation Level: Active   Participation Quality: Attentive, Sharing and Supportive   Affect: Appropriate  Cognitive: Alert and Oriented   Insight: Developing/Improving and Engaged   Engagement in Therapy: Developing/Improving and Engaged   Modes of Intervention: Clarification, Confrontation, Discussion, Education, Exploration,  Limit-setting, Orientation, Problem-solving, Rapport Building, Dance movement psychotherapisteality Testing, Socialization and Support  Summary of Progress/Problems: The topic for group therapy was feelings about diagnosis. Pt actively participated in group discussion on their past and current diagnosis and how they feel towards this. Pt also identified how society and family members judge them, based on their diagnosis as well as stereotypes and stigmas. Pt reported he feels stigmatized by his diagnosis. Pt reports he feels as if his diagnosis is misunderstood by family members.  Pt feels as if he has empathy skills as a result of his diagnosis and that this is a strength. Pt was polite and cooperative with the CSW and other group members and focused and attentive to the topics discussed and the sharing of others.     Dorothe PeaJonathan F. Emmani Lesueur, LCSWA, LCAS

## 2016-05-11 NOTE — Tx Team (Signed)
Interdisciplinary Treatment and Diagnostic Plan Update  05/11/2016 Time of Session: 10:30AM Philip French MRN: [id number]  Principal Diagnosis: Bipolar affective disorder, mixed, severe, with psychotic behavior (HCC)  Secondary Diagnoses: Principal Problem:   Bipolar affective disorder, mixed, severe, with psychotic behavior (HCC) Active Problems:   Cannabis use disorder, moderate, dependence (HCC)   Involuntary commitment   Tobacco use disorder   Suicidal ideation   PTSD (post-traumatic stress disorder)   Current Medications:  Current Facility-Administered Medications  Medication Dose Route Frequency Provider Last Rate Last Dose  . acetaminophen (TYLENOL) tablet 650 mg  650 mg Oral Q6H PRN Laveda Abbe, NP      . alum & mag hydroxide-simeth (MAALOX/MYLANTA) 200-200-20 MG/5ML suspension 30 mL  30 mL Oral Q4H PRN Laveda Abbe, NP      . lamoTRIgine (LAMICTAL) tablet 25 mg  25 mg Oral QHS Jolanta B Pucilowska, MD   25 mg at 05/10/16 2240  . magnesium hydroxide (MILK OF MAGNESIA) suspension 30 mL  30 mL Oral Daily PRN Laveda Abbe, NP      . prazosin (MINIPRESS) capsule 2 mg  2 mg Oral BID Shari Prows, MD   2 mg at 05/11/16 0842  . promethazine (PHENERGAN) tablet 25 mg  25 mg Oral Q6H PRN Shari Prows, MD   25 mg at 05/11/16 0645  . QUEtiapine (SEROQUEL) tablet 100 mg  100 mg Oral BID Shari Prows, MD   100 mg at 05/11/16 0842  . temazepam (RESTORIL) capsule 15 mg  15 mg Oral QHS Shari Prows, MD   15 mg at 05/10/16 2240   PTA Medications: Prescriptions Prior to Admission  Medication Sig Dispense Refill Last Dose  . naproxen (NAPROSYN) 500 MG tablet Take 1 tablet (500 mg total) by mouth 2 (two) times daily with a meal. 30 tablet 0   . traMADol (ULTRAM) 50 MG tablet Take 1 tablet (50 mg total) by mouth every 6 (six) hours as needed. 12 tablet 0     Patient Stressors: Educational concerns Marital or family  conflict  Patient Strengths: Active sense of humor Psychologist, counselling means  Treatment Modalities: Medication Management, Group therapy, Case management,  1 to 1 session with clinician, Psychoeducation, Recreational therapy.   Physician Treatment Plan for Primary Diagnosis: Bipolar affective disorder, mixed, severe, with psychotic behavior (HCC) Long Term Goal(s): Improvement in symptoms so as ready for discharge Improvement in symptoms so as ready for discharge   Short Term Goals: Ability to identify changes in lifestyle to reduce recurrence of condition will improve Ability to verbalize feelings will improve Ability to disclose and discuss suicidal ideas Ability to demonstrate self-control will improve Ability to identify and develop effective coping behaviors will improve Ability to maintain clinical measurements within normal limits will improve Compliance with prescribed medications will improve Ability to identify changes in lifestyle to reduce recurrence of condition will improve Ability to identify and develop effective coping behaviors will improve Ability to identify triggers associated with substance abuse/mental health issues will improve  Medication Management: Evaluate patient's response, side effects, and tolerance of medication regimen.  Therapeutic Interventions: 1 to 1 sessions, Unit Group sessions and Medication administration.  Evaluation of Outcomes: Progressing  Physician Treatment Plan for Secondary Diagnosis: Principal Problem:   Bipolar affective disorder, mixed, severe, with psychotic behavior (HCC) Active Problems:   Cannabis use disorder, moderate, dependence (HCC)   Involuntary commitment   Tobacco use disorder   Suicidal ideation   PTSD (post-traumatic stress  disorder)  Long Term Goal(s): Improvement in symptoms so as ready for discharge Improvement in symptoms so as ready for discharge   Short Term Goals: Ability to identify  changes in lifestyle to reduce recurrence of condition will improve Ability to verbalize feelings will improve Ability to disclose and discuss suicidal ideas Ability to demonstrate self-control will improve Ability to identify and develop effective coping behaviors will improve Ability to maintain clinical measurements within normal limits will improve Compliance with prescribed medications will improve Ability to identify changes in lifestyle to reduce recurrence of condition will improve Ability to identify and develop effective coping behaviors will improve Ability to identify triggers associated with substance abuse/mental health issues will improve     Medication Management: Evaluate patient's response, side effects, and tolerance of medication regimen.  Therapeutic Interventions: 1 to 1 sessions, Unit Group sessions and Medication administration.  Evaluation of Outcomes: Progressing   RN Treatment Plan for Primary Diagnosis: Bipolar affective disorder, mixed, severe, with psychotic behavior (HCC) Long Term Goal(s): Knowledge of disease and therapeutic regimen to maintain health will improve  Short Term Goals: Ability to verbalize frustration and anger appropriately will improve, Ability to demonstrate self-control, Ability to verbalize feelings will improve, Ability to identify and develop effective coping behaviors will improve and Compliance with prescribed medications will improve  Medication Management: RN will administer medications as ordered by provider, will assess and evaluate patient's response and provide education to patient for prescribed medication. RN will report any adverse and/or side effects to prescribing provider.  Therapeutic Interventions: 1 on 1 counseling sessions, Psychoeducation, Medication administration, Evaluate responses to treatment, Monitor vital signs and CBGs as ordered, Perform/monitor CIWA, COWS, AIMS and Fall Risk screenings as ordered, Perform  wound care treatments as ordered.  Evaluation of Outcomes: Progressing   LCSW Treatment Plan for Primary Diagnosis: Bipolar affective disorder, mixed, severe, with psychotic behavior (HCC) Long Term Goal(s): Safe transition to appropriate next level of care at discharge, Engage patient in therapeutic group addressing interpersonal concerns.  Short Term Goals: Engage patient in aftercare planning with referrals and resources, Increase social support, Facilitate acceptance of mental health diagnosis and concerns and Identify triggers associated with mental health/substance abuse issues  Therapeutic Interventions: Assess for all discharge needs, 1 to 1 time with Social worker, Explore available resources and support systems, Assess for adequacy in community support network, Educate family and significant other(s) on suicide prevention, Complete Psychosocial Assessment, Interpersonal group therapy.  Evaluation of Outcomes: Progressing   Progress in Treatment: Attending groups: Yes. Participating in groups: Yes. Taking medication as prescribed: Yes. Toleration medication: Yes. Family/Significant other contact made: Yes, individual(s) contacted:  mother Patient understands diagnosis: Yes. Discussing patient identified problems/goals with staff: Yes. Medical problems stabilized or resolved: Yes. Denies suicidal/homicidal ideation: Yes. Issues/concerns per patient self-inventory: No.   New problem(s) identified: No, Describe:  None  New Short Term/Long Term Goal(s):  Discharge Plan or Barriers:   Reason for Continuation of Hospitalization: Aggression Anxiety Depression Suicidal ideation  Estimated Length of Stay: 3-5 additional days   Attendees: Patient: Philip French 05/11/2016 11:06 AM  Physician: Dr. Kristine LineaJolanta Pucilowska, MD 05/11/2016 11:06 AM  Nursing: Hulan AmatoGwen Farrish, RN  05/11/2016 11:06 AM  RN Care Manager: 05/11/2016 11:06 AM  Social Worker: Hampton AbbotKadijah Marilyne Haseley, MSW, LCSW-A  05/11/2016 11:06 AM  Recreational Therapist: Hershal CoriaBeth Greene, LRT, CTRS  05/11/2016 11:06 AM    Scribe for Treatment Team: Philip French, LCSWA 05/11/2016 11:06 AM

## 2016-05-11 NOTE — Plan of Care (Signed)
Problem: Coping: Goal: Ability to identify and develop effective coping behavior will improve Outcome: Progressing Pt reports that he is working on being patient with himself and his mom. Goal: Ability to use eye contact when communicating with others will improve Outcome: Progressing Pt maintains eye contact when interacting with staff and peers.
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
## 2016-05-11 NOTE — Plan of Care (Signed)
Problem: Coping: Goal: Ability to identify and develop effective coping behavior will improve Outcome: Progressing Attending unit programing  Working on coping skills

## 2016-05-11 NOTE — Progress Notes (Signed)
Pt c/o vomiting x1 this morning. MD on call notified and PRN medication ordered and administered.

## 2016-05-11 NOTE — [facility] Group Notes (Signed)
[facility] Group Notes:  (Nursing/MHT/Case Management/Adjunct)  Date:  05/11/2016  Time:  2:07 PM  Type of Therapy:  Psychoeducational Skills  Participation Level:  Active  Participation Quality:  Appropriate, Attentive and Supportive  Affect:  Appropriate  Cognitive:  Appropriate  Insight:  Appropriate  Engagement in Group:  Engaged and Supportive  Modes of Intervention:  Discussion and Education  Summary of Progress/Problems:  Philip French 05/11/2016, 2:07 PM

## 2016-05-11 NOTE — [facility] Suicide Risk Assessment (Signed)
[facility] INPATIENT:  Family/Significant Other Suicide Prevention Education  Suicide Prevention Education:  Education Completed; mother, Charlestine NightChristina Frew ph#: [phone]) [phone]  has been identified by the patient as the family member/significant other with whom the patient will be residing, and identified as the person(s) who will aid the patient in the event of a mental health crisis (suicidal ideations/suicide attempt).  With written consent from the patient, the family member/significant other has been provided the following suicide prevention education, prior to the and/or following the discharge of the patient.  The suicide prevention education provided includes the following:  Suicide risk factors  Suicide prevention and interventions  National Suicide Hotline telephone number  [facility] assessment telephone number  [facility] Emergency Assistance 911  [facility] and/or [facility]idential [facility] Crisis [facility] telephone number  Request made of family/significant other to:  Remove weapons (e.g., guns, rifles, knives), all items previously/currently identified as safety concern.    Remove drugs/medications (over-the-counter, prescriptions, illicit drugs), all items previously/currently identified as a safety concern.  The family member/significant other verbalizes understanding of the suicide prevention education information provided.  The family member/significant other agrees to remove the items of safety concern listed above.  Lynden OxfordKadijah R Radwan Cowley, MSW, LCSW-A 05/11/2016, 12:05 PM
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
## 2016-05-11 NOTE — Progress Notes (Signed)
Recreation Therapy Notes  Date: 10.10.17 Time: 9:30 am Location: Craft Room  Group Topic: Goal Setting  Goal Area(s) Addresses:  Patient will write at least one goal. Patient will write at least one obstacle.  Behavioral Response: Attentive, Interactive, Inappropriate  Intervention: Recovery Goal Chart  Activity: Patients were instructed to make Recovery Goal Charts including their goals, obstacles, the date they started working on their goals, and the date they achieved their goals.  Education: LRT educated patients on healthy ways they can celebrate reaching their goals.  Education Outcome: Acknowledges education/In group clarification offered   Clinical Observations/Feedback: Patient completed activity by writing goals and obstacles. Patient contributed to group discussion by stating how he can keep himself focused on his goals, how he can overcome his obstacles, and that he has people to help him with his goals. When asked how he could celebrate reaching his goals in a healthy way, patients response was a blunt and a beer. LRT informed that was not a healthy way to celebrate reaching his goals.  Jacquelynn CreeGreene,Philip French, LRT/CTRS 05/11/2016 10:16 AM

## 2016-05-11 NOTE — [facility] Group Notes (Signed)
Goals Group  Date/Time: 05/11/2016 9am  Type of Therapy and Topic: Group Therapy: Goals Group: SMART Goals   Pt was called, but did not attend   Adrienne Trombetta F. Vonn Sliger, LCSWA, LCAS  

## 2016-05-11 NOTE — Progress Notes (Signed)
D: Patient stated slept fair last night .Stated appetite isfairand energy level  Is normal. Stated concentration is good . Stated on Depression scale 6, hopeless 3 and anxiety 5.( low 0-10 high) Denies suicidal  homicidal ideations  .  No auditory hallucinations  No pain concerns . Appropriate ADL'S. Interacting with peers and staff. Patient voice of craving  Agitation and irritability. Voice of being foggy  Headed. Medication added to regiment  Patient compliant  A: Encourage patient participation with unit programming . Instruction  Given on  Medication , verbalize understanding. R: Voice no other concerns. Staff continue to monitor

## 2016-05-11 NOTE — Progress Notes (Signed)
[facility] MD Progress Note  05/11/2016 1:28 PM ANDREWJAMES French  MRN:  [id number]  Subjective:  Philip French feels somewhat better today. He is no longer crying or distraught. He met with treatment team this morning and had appropriate interaction with staff and peers. He slept well with restoril. He seems to tolerate medications well but vomited once this am. H did not allow his mother to visit hin last night stating that he needs some space. He denies psychotic symptoms but is somewhat intrusive, restless and grandiose.  Principal Problem: Bipolar affective disorder, mixed, severe, with psychotic behavior ([place]) Diagnosis:   Patient Active Problem List   Diagnosis Date Noted  . Cannabis use disorder, moderate, dependence ([place]) [F12.20] 05/10/2016  . Involuntary commitment [Z04.6] 05/10/2016  . Tobacco use disorder [F17.200] 05/10/2016  . Suicidal ideation [R45.851] 05/10/2016  . PTSD (post-traumatic stress disorder) [F43.10] 05/10/2016  . Bipolar affective disorder, mixed, severe, with psychotic behavior ([place]) [F31.64] 05/07/2016   Total Time spent with patient: 20 minutes  Past Psychiatric History: bipolar disorder.  Past Medical History:  Past Medical History:  Diagnosis Date  . ADHD (attention deficit hyperactivity disorder)   . Anxiety   . Concussion   . Depression   . Kidney stone     Past Surgical History:  Procedure Laterality Date  . LITHOTRIPSY     Family History: History reviewed. No pertinent family history. Family Psychiatric  History: See H&P. Social History:  History  Alcohol Use  . Yes     History  Drug Use No    Social History   Social History  . Marital status: Single    Spouse name: N/A  . Number of children: N/A  . Years of education: N/A   Social History Main Topics  . Smoking status: Light Tobacco Smoker    Types: Cigarettes  . Smokeless tobacco: Never Used  . Alcohol use Yes  . Drug use: No  . Sexual activity: Not Asked   Other Topics  Concern  . None   Social History Narrative  . None   Additional Social History:                         Sleep: Fair  Appetite:  Fair  Current Medications: Current Facility-Administered Medications  Medication Dose Route Frequency Provider Last Rate Last Dose  . acetaminophen (TYLENOL) tablet 650 mg  650 mg Oral Q6H PRN Ethelene Hal, NP      . alum & mag hydroxide-simeth (MAALOX/MYLANTA) 200-200-20 MG/5ML suspension 30 mL  30 mL Oral Q4H PRN Ethelene Hal, NP      . lamoTRIgine (LAMICTAL) tablet 25 mg  25 mg Oral QHS Jolanta B Pucilowska, MD   25 mg at 05/10/16 2240  . magnesium hydroxide (MILK OF MAGNESIA) suspension 30 mL  30 mL Oral Daily PRN Ethelene Hal, NP      . prazosin (MINIPRESS) capsule 2 mg  2 mg Oral BID Clovis Fredrickson, MD   2 mg at 05/11/16 0842  . promethazine (PHENERGAN) tablet 25 mg  25 mg Oral Q6H PRN Clovis Fredrickson, MD   25 mg at 05/11/16 0645  . QUEtiapine (SEROQUEL) tablet 100 mg  100 mg Oral BID Clovis Fredrickson, MD   100 mg at 05/11/16 0842  . temazepam (RESTORIL) capsule 15 mg  15 mg Oral QHS Jolanta B Pucilowska, MD   15 mg at 05/10/16 2240    Lab Results: No results found  for this or any previous visit (from the past 48 hour(s)).  Blood Alcohol level:  Lab Results  Component Value Date   ETH <5 75/30/0511    Metabolic Disorder Labs: No results found for: HGBA1C, MPG No results found for: PROLACTIN No results found for: CHOL, TRIG, HDL, CHOLHDL, VLDL, LDLCALC  Physical Findings: AIMS:  , ,  ,  ,    CIWA:    COWS:     Musculoskeletal: Strength & Muscle Tone: within normal limits Gait & Station: normal Patient leans: N/A  Psychiatric Specialty Exam: Physical Exam  Nursing note and vitals reviewed.   Review of Systems  Gastrointestinal: Positive for vomiting.  Psychiatric/Behavioral: Positive for depression. The patient is nervous/anxious and has insomnia.     Blood pressure (!) 146/60,  pulse 61, temperature 97.9 F (36.6 C), temperature source Oral, resp. rate 18, height '5\' 9"'  (1.753 m), weight 81.2 kg (179 lb), SpO2 100 %.Body mass index is 26.43 kg/m.  General Appearance: Casual  Eye Contact:  Good  Speech:  Clear and Coherent  Volume:  Normal  Mood:  Anxious  Affect:  Appropriate  Thought Process:  Goal Directed  Orientation:  Full (Time, Place, and Person)  Thought Content:  Delusions and Paranoid Ideation  Suicidal Thoughts:  No  Homicidal Thoughts:  No  Memory:  Immediate;   Fair Recent;   Fair Remote;   Fair  Judgement:  Impaired  Insight:  Shallow  Psychomotor Activity:  Increased  Concentration:  Concentration: Fair and Attention Span: Fair  Recall:  [employer] of Knowledge:  Fair  Language:  Fair  Akathisia:  No  Handed:  Right  AIMS (if indicated):     Assets:  Communication Skills Desire for Improvement Financial Resources/Insurance Housing Physical Health Resilience Social Support Talents/Skills Transportation Vocational/Educational  ADL's:  Intact  Cognition:  WNL  Sleep:  Number of Hours: 5     Treatment Plan Summary: Daily contact with patient to assess and evaluate symptoms and progress in treatment and Medication management   Philip French is an 25 year old male with a history of bipolar disorder admitted for suicidal and homicidal ideation the context of treatment noncompliance and severe academic and social stressors.  1. Suicidal and homicidal ideation.  The patient is able to contract for safety in the hospital.  2. Agitation. This has resolved.   3. Mood. We continue Seroquel that was initiated in the emergency room and restarted Lamictal for mood stabilization.  4. PTSD. We started Minipress twice daily for nightmares and flashbacks.  5. Marijuana use. The patient minimizes his problems and declines treatment.  6. Smoking. Nicotine patch is available.  7. ADHD. Will not restart Vyvanse at present.  8.  Anxiety. The patient is asking for Xanax will offer Vistaril.  9. Insomnia. We will give Restoril in addition to Seroquel.  10. Disposition. He will be discharged with his family. He will follow up with his psychiatrist in [place].  Orson Slick, MD 05/11/2016, 1:28 PM

## 2016-05-12 MED ORDER — QUETIAPINE FUMARATE 200 MG PO TABS
200.0000 mg | ORAL_TABLET | Freq: Every day | ORAL | Status: DC
  Administered 2016-05-13 – 2016-05-15 (×3): 200 mg via ORAL
  Filled 2016-05-12 (×3): qty 1

## 2016-05-12 NOTE — Plan of Care (Signed)
Problem: Coping: Goal: Ability to identify and develop effective coping behavior will improve Outcome: Progressing Attending groups , attempted to resolved  concerns

## 2016-05-12 NOTE — Progress Notes (Signed)
[facility]  05/12/2016 12:39 PM Philip Bernhardteter J French  MRN:  [id number]  Subjective:  Philip French feels rather frightened and somewhat paranoid today. He spoke with his mother and father and they would like for his psychiatrist to contact them. He is uncertain if he wants to give me the permission. He would like to be in the room during the conversation. He would like his conversation to be limited to medication management on May and to reassure his parents is making "slow but consistent progress". He reports poor sleep last night in spite of dose of Restoril. This morning she was rather agitated and arguing with the staff demanding to be able to shave his chosen time. He was noted this morning laud, laughing, disruptive in the milieu. He no longer complains of upset stomach with medications. He no longer wants to take Minipress for PTSD type symptoms. He agrees to switch Seroquel to nighttime improve sleep. We had a long discussion about side effects medications including Seroquel. He is aware that it may cause weight gain as well as metabolic syndrome. He tells me that he is okay to take it for the next "5 years" . He remains Irritable. He denies psychotic symptoms but is  intrusive, restless and grandiose.  Principal Problem: Bipolar affective disorder, mixed, severe, with psychotic behavior ([facility]) Diagnosis:   Patient Active Problem List   Diagnosis Date Noted  . Cannabis use disorder, moderate, dependence ([facility]) [F12.20] 05/10/2016  . Involuntary commitment [Z04.6] 05/10/2016  . Tobacco use disorder [F17.200] 05/10/2016  . Suicidal ideation [R45.851] 05/10/2016  . PTSD (post-traumatic stress disorder) [F43.10] 05/10/2016  . Bipolar affective disorder, mixed, severe, with psychotic behavior ([facility]) [F31.64] 05/07/2016   Total Time spent with patient: 20 minutes  Past Psychiatric History: bipolar disorder.  Past Medical History:  Past Medical History:  Diagnosis Date  . ADHD (attention  deficit hyperactivity disorder)   . Anxiety   . Concussion   . Depression   . Kidney stone     Past Surgical History:  Procedure Laterality Date  . LITHOTRIPSY     Family History: History reviewed. No pertinent family history. Family Psychiatric  History: See H&P. Social History:  History  Alcohol Use  . Yes     History  Drug Use No    Social History   Social History  . Marital status: Single    Spouse name: N/A  . Number of children: N/A  . Years of education: N/A   Social History Main Topics  . Smoking status: Light Tobacco Smoker    Types: Cigarettes  . Smokeless tobacco: Never Used  . Alcohol use Yes  . Drug use: No  . Sexual activity: Not Asked   Other Topics Concern  . None   Social History Narrative  . None   Additional Social History:                         Sleep: Fair  Appetite:  Fair  Current Medications: Current Facility-Administered Medications  Medication Dose Route Frequency Provider Last Rate Last Dose  . acetaminophen (TYLENOL) tablet 650 mg  650 mg Oral Q6H PRN Laveda AbbeLaurie Britton Parks, NP      . alum & mag hydroxide-simeth (MAALOX/MYLANTA) 200-200-20 MG/5ML suspension 30 mL  30 mL Oral Q4H PRN Laveda AbbeLaurie Britton Parks, NP      . hydrOXYzine (ATARAX/VISTARIL) tablet 25 mg  25 mg Oral TID PRN Shari ProwsJolanta B Carsen Machi, MD      .  lamoTRIgine (LAMICTAL) tablet 25 mg  25 mg Oral QHS Shari Prows, MD   25 mg at 05/11/16 2139  . magnesium hydroxide (MILK OF MAGNESIA) suspension 30 mL  30 mL Oral Daily PRN Laveda Abbe, NP      . promethazine (PHENERGAN) tablet 25 mg  25 mg Oral Q6H PRN Shari Prows, MD   25 mg at 05/11/16 0645  . [START ON 05/13/2016] QUEtiapine (SEROQUEL) tablet 200 mg  200 mg Oral QHS Annmargaret Decaprio B Amirra Herling, MD      . temazepam (RESTORIL) capsule 15 mg  15 mg Oral QHS Kebra Lowrimore B Saksham Akkerman, MD   15 mg at 05/11/16 2139    Lab Results: No results found for this or any previous visit (from the past 48  hour(s)).  Blood Alcohol level:  Lab Results  Component Value Date   ETH <5 05/06/2016    Metabolic Disorder Labs: No results found for: HGBA1C, MPG No results found for: PROLACTIN No results found for: CHOL, TRIG, HDL, CHOLHDL, VLDL, LDLCALC  Physical Findings: AIMS:  , ,  ,  ,    CIWA:    COWS:     Musculoskeletal: Strength & Muscle Tone: within normal limits Gait & Station: normal Patient leans: N/A  Psychiatric Specialty Exam: Physical Exam  Nursing note and vitals reviewed.   Review of Systems  Gastrointestinal: Positive for vomiting.  Psychiatric/Behavioral: Positive for depression. The patient is nervous/anxious and has insomnia.     Blood pressure 110/69, pulse (!) 58, temperature 98.1 F (36.7 C), temperature source Oral, resp. rate 16, height 5\' 9"  (1.753 m), weight 81.2 kg (179 lb), SpO2 100 %.Body mass index is 26.43 kg/m.  General Appearance: Casual  Eye Contact:  Good  Speech:  Clear and Coherent  Volume:  Normal  Mood:  Anxious  Affect:  Appropriate  Thought Process:  Goal Directed  Orientation:  Full (Time, Place, and Person)  Thought Content:  Delusions and Paranoid Ideation  Suicidal Thoughts:  No  Homicidal Thoughts:  No  Memory:  Immediate;   Fair Recent;   Fair Remote;   Fair  Judgement:  Impaired  Insight:  Shallow  Psychomotor Activity:  Increased  Concentration:  Concentration: Fair and Attention Span: Fair  Recall:  [employer] of Knowledge:  Fair  Language:  Fair  Akathisia:  No  Handed:  Right  AIMS (if indicated):     Assets:  Communication Skills Desire for Improvement Financial Resources/Insurance Housing Physical Health Resilience Social Support Talents/Skills Transportation Vocational/Educational  ADL's:  Intact  Cognition:  WNL  Sleep:  Number of Hours: 7     Treatment Plan Summary: Daily contact with patient to assess and evaluate symptoms and progress in treatment and Medication management   Philip French is  an 25 year old male with a history of bipolar disorder admitted for suicidal and homicidal ideation the context of treatment noncompliance and severe academic and social stressors.  1. Suicidal and homicidal ideation.  The patient is able to contract for safety in the hospital.  2. Agitation. This has resolved.   3. Mood. We continue Seroquel, but switch to nighttime, and Lamictal for mood stabilization.  4. PTSD. We discontinued Minipress upon patient request.   5. Marijuana use. The patient minimizes his problems and declines treatment.  6. Smoking. Nicotine patch is available.  7. ADHD. Will not restart Vyvanse at present.  8. Anxiety. The patient is asking for Xanax will offer Vistaril.  9. Insomnia. We started Restoril in  addition to Seroquel.  10. Disposition. He will be discharged with his family. He will follow up with his psychiatrist in [place].  Kristine Linea, MD 05/12/2016, 12:39 PM

## 2016-05-12 NOTE — [facility] Group Notes (Signed)
[facility] Group Notes:  (Nursing/MHT/Case Management/Adjunct)  Date:  05/12/2016  Time:  5:31 PM  Type of Therapy:  Psychoeducational Skills  Participation Level:  Active  Participation Quality:  Appropriate  Affect:  Appropriate  Cognitive:  Appropriate  Insight:  Appropriate  Engagement in Group:  Engaged and Supportive  Modes of Intervention:  Discussion, Education and Support  Summary of Progress/Problems:  Noelle PennerKristen J Janit Cutter 05/12/2016, 5:31 PM

## 2016-05-12 NOTE — Progress Notes (Signed)
D: Patient stated slept good last night .Stated appetite is good and energy level  Is normal. Stated concentration is good . Stated on Depression scale 5 , hopeless 4 and anxiety 5 .( low 0-10 high) Denies suicidal  homicidal ideations  .  No auditory hallucinations  No pain concerns . Appropriate ADL'S. Interacting with peers and staff. Patient voice of having vivid dreams . Mother sent books  From [employer] . Patient voice of having / gaining patience with his parents  Continue to voice concerns around the long term effects of medication. A: Encourage patient participation with unit programming . Instruction  Given on  Medication , verbalize understanding. R: Voice no other concerns. Staff continue to monitor

## 2016-05-12 NOTE — Progress Notes (Signed)
Recreation Therapy Notes  Date: 10.11.17 Time: 9:30 am Location: Craft Room  Group Topic: Self-esteem  Goal Area(s) Addresses:  Patient will write at least one positive trait about self. Patient will verbalize why it is important to have a healthy self-esteem.  Behavioral Response: Attentive, Interactive  Intervention: I Am  Activity: Patients were given a worksheet with the letter I on it and instructed to write as many positive traits inside the letter.   Education: LRT educated patients on ways they can increase their self-esteem.  Education Outcome: Acknowledges education/In group clarification offered  Clinical Observations/Feedback: Patient completed activity by writing positive traits. Patient contributed to group discussion by stating that it was difficult to think of positive traits and why, how it felt to list positive traits, and how he can increase his self-esteem.  Jacquelynn CreeGreene,Jeslyn Amsler M, LRT/CTRS 05/12/2016 10:28 AM

## 2016-05-12 NOTE — [facility] Group Notes (Signed)
[facility] LCSW Group Therapy   05/12/2016 1pm  Type of Therapy: Group Therapy   Participation Level: Active   Participation Quality: Attentive, Sharing and Supportive   Affect: Depressed and Flat   Cognitive: Alert and Oriented   Insight: Developing/Improving and Engaged   Engagement in Therapy: Developing/Improving and Engaged   Modes of Intervention: Clarification, Confrontation, Discussion, Education, Exploration, Limit-setting, Orientation, Problem-solving, Rapport Building, Dance movement psychotherapisteality Testing, Socialization and Support   Summary of Progress/Problems: The topic for group today was emotional regulation. This group focused on both positive and negative emotion identification and allowed  group members to process ways to identify feelings, regulate negative emotions, and find healthy ways to manage internal/external emotions. Group members were asked to reflect on a time when their reaction to an emotion led to a negative outcome and explored how alternative responses using emotion regulation would have benefited them. Group members were also asked to discuss a time when emotion regulation was utilized when a negative emotion was experienced. Pt shared the pt recently was unable to regulate the pt's emotions when the pt and his mother had an argument.  Pt shared that in the past the pt would utilize smoking marijuana in order to regulate the pt's emotions, but that this was no longer working effectively. Pt reported the pt would be more likely now to use healthy coping mechanisms like "taking a walk" and whe the pt did not this led to the pt's admission to the hospital.  Pt presented as agitated and irritable, but would at times make jokes with other group members.     Philip French, MSW, LCSWA, LCAS

## 2016-05-12 NOTE — Progress Notes (Signed)
D: Pt affect is bright this evening. He is seen in the milieu laughing and joking with other patients. Pt reports that his goal today was "to be patient with myself. My mother stresses me out." He reports that prescribed medications have helped with his flashbacks. Pt discusses triggers for flashbacks, such as certain smells. He rates anxiety 6/10 and depression 7/10. Denies SI/HI/AVH at this time. A: Emotional support and encouragement provided. Medications administered with education. q15 minute safety checks maintained. R: Pt remains free from harm. Will continue to monitor.

## 2016-05-13 NOTE — Plan of Care (Signed)
Problem: Coping: Goal: Ability to interact with others will improve Outcome: Progressing Pt interacting well with peers. No inappropriateness noted.

## 2016-05-13 NOTE — [facility] Group Notes (Signed)
[facility] LCSW Group Therapy   05/13/2016 1pm  Type of Therapy: Group Therapy   Participation Level: Active   Participation Quality: Attentive, Sharing and Supportive   Affect: Appropriate   Cognitive: Alert and Oriented   Insight: Developing/Improving and Engaged   Engagement in Therapy: Developing/Improving and Engaged   Modes of Intervention: Clarification, Confrontation, Discussion, Education, Exploration, Limit-setting, Orientation, Problem-solving, Rapport Building, Dance movement psychotherapisteality Testing, Socialization and Support   Summary of Progress/Problems: The topic for group was balance in life. Today's group focused on defining balance in one's own words, identifying things that can knock one offbalance, and exploring healthy ways to maintain balance in life. Group members were asked to provide an example of a time when they felt off balance, describe how they handled that situation, and process healthier ways to regain balance in the future. Group members were asked to share the most important tool for maintaining balance that they learned [facility] and how they plan to apply this method after discharge. 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 LCSW did not allow patient to self diagnose and advised him that getting a diagnosis should be left to the professionals. He did contribute to group and responded well to re-direction.   Barnard Sharps LCSW

## 2016-05-13 NOTE — [facility] Group Notes (Signed)
[facility] Group Notes:  (Nursing/MHT/Case Management/Adjunct)  Date:  05/13/2016  Time:  11:23 PM  Type of Therapy:  Psychoeducational Skills  Participation Level:  Active  Participation Quality:  Appropriate  Affect:  Appropriate  Cognitive:  Appropriate  Insight:  Good  Engagement in Group:  Engaged  Modes of Intervention:  Activity  Summary of Progress/Problems:  Philip French 05/13/2016, 11:23 PM

## 2016-05-13 NOTE — Progress Notes (Signed)
D: Observed pt in dayroom interacting with peers. Patient alert and oriented x4. Patient denies SI/HI/AVH. Pt affect is silly at times. Pt was appropriate on the unit this evening. Pt had no outbursts. Pt stated his depression is 4/10 and anxiety 3/10. Pt stated that he feels he's "getting back to normal..Marland Kitchen.I feel less irritable." Pt c/o of difficulty sleeping last night.  A: Offered active listening and support. Provided therapeutic communication. Administered scheduled medications. Reinforced appropriate behavior on the unit.  R: Pt pleasant and cooperative. Pt medication compliant. Will continue Q15 min. checks. Safety maintained.

## 2016-05-13 NOTE — Plan of Care (Signed)
Problem: Safety: Goal: Ability to remain free from injury will improve Outcome: Progressing Denies suicidal ideations

## 2016-05-13 NOTE — Progress Notes (Signed)
D: Patient  Being proactive with  His discharge  Disposition, questioning his outpatient provider, setting  Up explanation of discharge plans to parents. Continue to voice of having vivid dreams. Patient stated slept fair last night .Stated appetite is fair  and energy level  Is normal. Stated concentration is good . Stated on Depression scale 5, hopeless 4 and anxiety 4  .( low 0-10 high) Denies suicidal  homicidal ideations  .  No auditory hallucinations  No pain concerns . Appropriate ADL'S. Interacting with peers and staff.  A: Encourage patient participation with unit programming . Instruction  Given on  Medication , verbalize understanding. R: Voice no other concerns. Staff continue to monitor

## 2016-05-13 NOTE — Progress Notes (Signed)
[facility] MD Progress Note  05/13/2016 1:28 PM Philip French  MRN:  [id number]  Subjective:  Philip French feels slightly better today he had a long conversation by phone with his parents. He feels that they are more accepting of his mental illness and his difficulties. This is the first time they did not argue. His mother agreed to a group for CBT therapist in the area. His father seems to understand that he will not be able to return to school and graduate in May. The patient is still frightened and somewhat paranoid today. He is still laud and disruptive. He slept better with a combination of Seroquel and restoril but woke up several times. Recorder sleep was 7 hours. He remains Irritable. He denies psychotic symptoms but is  intrusive, restless and grandiose.  Principal Problem: Bipolar affective disorder, mixed, severe, with psychotic behavior (HCC) Diagnosis:   Patient Active Problem List   Diagnosis Date Noted  . Cannabis use disorder, moderate, dependence (HCC) [F12.20] 05/10/2016  . Involuntary commitment [Z04.6] 05/10/2016  . Tobacco use disorder [F17.200] 05/10/2016  . Suicidal ideation [R45.851] 05/10/2016  . PTSD (post-traumatic stress disorder) [F43.10] 05/10/2016  . Bipolar affective disorder, mixed, severe, with psychotic behavior (HCC) [F31.64] 05/07/2016   Total Time spent with patient: 20 minutes  Past Psychiatric History: bipolar disorder.  Past Medical History:  Past Medical History:  Diagnosis Date  . ADHD (attention deficit hyperactivity disorder)   . Anxiety   . Concussion   . Depression   . Kidney stone     Past Surgical History:  Procedure Laterality Date  . LITHOTRIPSY     Family History: History reviewed. No pertinent family history. Family Psychiatric  History: See H&P. Social History:  History  Alcohol Use  . Yes     History  Drug Use No    Social History   Social History  . Marital status: Single    Spouse name: N/A  . Number of children:  N/A  . Years of education: N/A   Social History Main Topics  . Smoking status: Light Tobacco Smoker    Types: Cigarettes  . Smokeless tobacco: Never Used  . Alcohol use Yes  . Drug use: No  . Sexual activity: Not Asked   Other Topics Concern  . None   Social History Narrative  . None   Additional Social History:                         Sleep: Fair  Appetite:  Fair  Current Medications: Current Facility-Administered Medications  Medication Dose Route Frequency Provider Last Rate Last Dose  . acetaminophen (TYLENOL) tablet 650 mg  650 mg Oral Q6H PRN Laveda Abbe, NP      . alum & mag hydroxide-simeth (MAALOX/MYLANTA) 200-200-20 MG/5ML suspension 30 mL  30 mL Oral Q4H PRN Laveda Abbe, NP      . hydrOXYzine (ATARAX/VISTARIL) tablet 25 mg  25 mg Oral TID PRN Shari Prows, MD      . lamoTRIgine (LAMICTAL) tablet 25 mg  25 mg Oral QHS Shari Prows, MD   25 mg at 05/12/16 2110  . magnesium hydroxide (MILK OF MAGNESIA) suspension 30 mL  30 mL Oral Daily PRN Laveda Abbe, NP      . promethazine (PHENERGAN) tablet 25 mg  25 mg Oral Q6H PRN Shari Prows, MD   25 mg at 05/11/16 0645  . QUEtiapine (SEROQUEL) tablet 200 mg  200  mg Oral QHS Felecia Stanfill B Imani Sherrin, MD      . temazepam (RESTORIL) capsule 15 mg  15 mg Oral QHS Aurea Aronov B Kyi Romanello, MD   15 mg at 05/12/16 2110    Lab Results: No results found for this or any previous visit (from the past 48 hour(s)).  Blood Alcohol level:  Lab Results  Component Value Date   ETH <5 05/06/2016    Metabolic Disorder Labs: No results found for: HGBA1C, MPG No results found for: PROLACTIN No results found for: CHOL, TRIG, HDL, CHOLHDL, VLDL, LDLCALC  Physical Findings: AIMS:  , ,  ,  ,    CIWA:    COWS:     Musculoskeletal: Strength & Muscle Tone: within normal limits Gait & Station: normal Patient leans: N/A  Psychiatric Specialty Exam: Physical Exam  Nursing note and  vitals reviewed.   Review of Systems  Gastrointestinal: Positive for vomiting.  Psychiatric/Behavioral: Positive for depression. The patient is nervous/anxious and has insomnia.     Blood pressure 114/77, pulse 72, temperature 97.9 F (36.6 C), temperature source Oral, resp. rate 16, height 5\' 9"  (1.753 m), weight 81.2 kg (179 lb), SpO2 100 %.Body mass index is 26.43 kg/m.  General Appearance: Casual  Eye Contact:  Good  Speech:  Clear and Coherent  Volume:  Normal  Mood:  Anxious  Affect:  Appropriate  Thought Process:  Goal Directed  Orientation:  Full (Time, Place, and Person)  Thought Content:  Delusions and Paranoid Ideation  Suicidal Thoughts:  No  Homicidal Thoughts:  No  Memory:  Immediate;   Fair Recent;   Fair Remote;   Fair  Judgement:  Impaired  Insight:  Shallow  Psychomotor Activity:  Increased  Concentration:  Concentration: Fair and Attention Span: Fair  Recall:  [employer]  Fund of Knowledge:  Fair  Language:  Fair  Akathisia:  No  Handed:  Right  AIMS (if indicated):     Assets:  Communication Skills Desire for Improvement Financial Resources/Insurance Housing Physical Health Resilience Social Support Talents/Skills Transportation Vocational/Educational  ADL's:  Intact  Cognition:  WNL  Sleep:  Number of Hours: 7     Treatment Plan Summary: Daily contact with patient to assess and evaluate symptoms and progress in treatment and Medication management   Philip French is an 25 year old male with a history of bipolar disorder admitted for suicidal and homicidal ideation the context of treatment noncompliance and severe academic and social stressors.  1. Suicidal and homicidal ideation.  The patient is able to contract for safety in the hospital.  2. Agitation. This has resolved.   3. Mood. We continue Seroquel, but switch to nighttime, and Lamictal for mood stabilization.  4. PTSD. We discontinued Minipress upon patient request.   5. Marijuana  use. The patient minimizes his problems and declines treatment.  6. Smoking. Nicotine patch is available.  7. ADHD. Will not restart Vyvanse at present.  8. Anxiety. The patient is asking for Xanax will offer Vistaril.  9. Insomnia. We started Restoril in addition to Seroquel.  10. Disposition. He will be discharged with his family. He [place] in [place].  Kristine LineaJolanta Shaymus Eveleth, MD 05/13/2016, 1:28 PM

## 2016-05-13 NOTE — Progress Notes (Signed)
Recreation Therapy Notes  Date: 10.12.17 Time: 9:30 am Location: Craft Room  Group Topic: Leisure Education  Goal Area(s) Addresses:  Patient will identify activities for each letter of the alphabet. Patient will verbalize ability to integrate positive leisure into life post d/c. Patient will verbalize ability to use leisure as a Associate Professorcoping skill.  Behavioral Response: Attentive, Interactive  Intervention: Leisure Alphabet  Activity: Patients were given a Leisure Information systems managerAlphabet worksheet and instructed to write healthy leisure activities for each letter of the alphabet.  Education: LRT educated patients on ways they can participate in leisure.  Education Outcome: Acknowledges education/In group clarification offered  Clinical Observations/Feedback: Patient completed activity by writing leisure activities. Patient contributed to group discussion by stating healthy leisure activities. When some activities were mentioned, patient would make statements about how he did not like the certain activities and why. LRT redirected patient. Patient also contributed to group discussion by stating how he can put leisure back into his life, and what makes leisure a good coping skill.  Jacquelynn CreeGreene,Carla Whilden M, LRT/CTRS 05/13/2016 10:32 AM

## 2016-05-14 DIAGNOSIS — F909 Attention-deficit hyperactivity disorder, unspecified type: Secondary | ICD-10-CM | POA: Diagnosis present

## 2016-05-14 MED ORDER — QUETIAPINE FUMARATE 200 MG PO TABS: 200.0000 mg | ORAL_TABLET | Freq: Every day | ORAL | 1 refills | Status: AC

## 2016-05-14 MED ORDER — HYDROXYZINE HCL 25 MG PO TABS: 25.0000 mg | ORAL_TABLET | Freq: Three times a day (TID) | ORAL | 0 refills | Status: AC | PRN

## 2016-05-14 MED ORDER — LAMOTRIGINE 25 MG PO TABS: 25.0000 mg | ORAL_TABLET | Freq: Every day | ORAL | 1 refills | Status: AC

## 2016-05-14 MED ORDER — TEMAZEPAM 15 MG PO CAPS: 15.0000 mg | ORAL_CAPSULE | Freq: Every day | ORAL | 0 refills | Status: AC

## 2016-05-14 NOTE — [facility] Group Notes (Signed)
[facility] Group Notes:  (Nursing/MHT/Case Management/Adjunct)  Date:  05/14/2016  Time:  4:15 PM  Type of Therapy:  Psychoeducational Skills  Participation Level:  Did Not Attend  Philip French Philip French Philip French 05/14/2016, 4:15 PM 

## 2016-05-14 NOTE — [facility] Suicide Risk Assessment (Signed)
[facility]   Principal Problem: Bipolar affective disorder, mixed, severe, with psychotic behavior [facility](HCC) Discharge Diagnoses:  Patient Active Problem List   Diagnosis Date Noted  . Attention deficit hyperactivity disorder (ADHD) [F90.9] 05/14/2016  . Cannabis use disorder, moderate, dependence (HCC) [F12.20] 05/10/2016  . Involuntary commitment [Z04.6] 05/10/2016  . Tobacco use disorder [F17.200] 05/10/2016  . Suicidal ideation [R45.851] 05/10/2016  . PTSD (post-traumatic stress disorder) [F43.10] 05/10/2016  . Bipolar affective disorder, mixed, severe, with psychotic behavior (HCC) [F31.64] 05/07/2016    Total Time spent with patient: 20 minutes  Musculoskeletal: Strength & Muscle Tone: within normal limits Gait & Station: normal Patient leans: N/A  Psychiatric Specialty Exam: Review of Systems  Psychiatric/Behavioral: Positive for substance abuse. The patient has insomnia.   All other systems reviewed and are negative.   Blood pressure 103/61, pulse 69, temperature 97.7 F (36.5 C), temperature source Oral, resp. rate 18, height 5\' 9"  (1.753 m), weight 81.2 kg (179 lb), SpO2 97 %.Body mass index is 26.43 kg/m.  General Appearance: Casual  Eye Contact::  Good  Speech:  Clear and Coherent409  Volume:  Normal  Mood:  Anxious  Affect:  Appropriate  Thought Process:  Goal Directed and Descriptions of Associations: Intact  Orientation:  Full (Time, Place, and Person)  Thought Content:  WDL  Suicidal Thoughts:  No  Homicidal Thoughts:  No  Memory:  Immediate;   Fair Recent;   Fair Remote;   Fair  Judgement:  Impaired  Insight:  Shallow  Psychomotor Activity:  Normal  Concentration:  Fair  Recall:  [employer]  Fund of Knowledge:Fair  Language: Fair  Akathisia:  No  Handed:  Right  AIMS (if indicated):     Assets:  Communication Skills Desire for Improvement Financial Resources/Insurance Housing Physical Health Resilience Social  Support Talents/Skills Transportation Vocational/Educational  Sleep:  Number of Hours: 6.5  Cognition: WNL  ADL's:  Intact   Mental Status Per Nursing Assessment::   On Admission:     Demographic Factors:  Male, Adolescent or young adult, Caucasian and Unemployed  Loss Factors: Decrease in vocational status  Historical Factors: Impulsivity  Risk Reduction Factors:   Sense of responsibility to family, Living with another person, especially a relative, Positive social support and Positive therapeutic relationship  Continued Clinical Symptoms:  Bipolar Disorder:   Bipolar II Alcohol/Substance Abuse/Dependencies  Cognitive Features That Contribute To Risk:  None    Suicide Risk:  Minimal: No identifiable suicidal ideation.  Patients presenting with no risk factors but with morbid ruminations; may be classified as minimal risk based on the severity of the depressive symptoms  Follow-up Information    [facility].   Why:  Please arrive to your intake assessment with [facility] at 11:15AM. It is important to arrive 15 minutes early for prompt services and with your discharge paperwork for medications and therapeutic services. Contact information: Address: [address]  [place], [place] [address] Phone: [phone] Fax: [phone]          Plan Of Care/Follow-up recommendations:  Activity:  As tolerated. Diet:  Regular. Other:  Keep follow-up appointments.  Kristine LineaJolanta Danyl Deems, MD 05/14/2016, 11:26 AM

## 2016-05-14 NOTE — Progress Notes (Signed)
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 He was started on Seroquel and restarted on Lamictal titration. He's been improving every day. He is no longer agitated, labile or floridly psychotic. His parents are coming from [place] on Saturday night to pick him up from the hospital on Sunday morning. We will write orders, prescriptions, and discharge summary today.   Philip French continues to improve. He slept better last night with a combination of Seroquel and Restoril. He no longer reports vivid dreams that he now thinks was caused by Minipress. Minipress was discontinued. He spoke with his parents without arguing. They will come to pick him up on Sunday morning. He accepts medications and tolerates them well. Good group participation. There are no somatic complaints.   Principal Problem: Bipolar affective disorder, mixed, severe, with psychotic behavior (HCC) Diagnosis:   Patient Active Problem List   Diagnosis Date Noted  . Cannabis use disorder, moderate, dependence (HCC) [F12.20] 05/10/2016  . Involuntary commitment [Z04.6] 05/10/2016  . Tobacco use disorder [F17.200] 05/10/2016  . Suicidal ideation [R45.851] 05/10/2016  . PTSD (post-traumatic stress disorder) [F43.10] 05/10/2016  . Bipolar affective disorder, mixed, severe, with psychotic behavior (HCC) [F31.64] 05/07/2016   Total Time spent with patient: 20 minutes  Past Psychiatric History: bipolar disorder.  Past Medical History:  Past Medical History:  Diagnosis Date  . ADHD (attention deficit hyperactivity disorder)   . Anxiety   . Concussion   . Depression   . Kidney stone     Past Surgical History:  Procedure Laterality Date  . LITHOTRIPSY     Family History: History reviewed. No  pertinent family history. Family Psychiatric  History: See H&P. Social History:  History  Alcohol Use  . Yes     History  Drug Use No    Social History   Social History  . Marital status: Single    Spouse name: N/A  . Number of children: N/A  . Years of education: N/A   Social History Main Topics  . Smoking status: Light Tobacco Smoker    Types: Cigarettes  . Smokeless tobacco: Never Used  . Alcohol use Yes  . Drug use: No  . Sexual activity: Not Asked   Other Topics Concern  . None   Social History Narrative  . None   Additional Social History:                         Sleep: Fair  Appetite:  Fair  Current Medications: Current Facility-Administered Medications  Medication Dose Route Frequency Provider Last Rate Last Dose  . acetaminophen (TYLENOL) tablet 650 mg  650 mg Oral Q6H PRN Laveda Abbe, NP      . alum & mag hydroxide-simeth (MAALOX/MYLANTA) 200-200-20 MG/5ML suspension 30 mL  30 mL Oral Q4H PRN Laveda Abbe, NP      . hydrOXYzine (ATARAX/VISTARIL) tablet 25 mg  25 mg Oral TID PRN Shari Prows, MD      . lamoTRIgine (LAMICTAL) tablet 25 mg  25 mg Oral QHS Shari Prows, MD   25 mg at 05/13/16 2118  . magnesium hydroxide (MILK OF MAGNESIA) suspension 30 mL  30 mL Oral Daily PRN Laveda Abbe, NP      .  promethazine (PHENERGAN) tablet 25 mg  25 mg Oral Q6H PRN Shari Prows, MD   25 mg at 05/11/16 0645  . QUEtiapine (SEROQUEL) tablet 200 mg  200 mg Oral QHS Shari Prows, MD   200 mg at 05/13/16 2118  . temazepam (RESTORIL) capsule 15 mg  15 mg Oral QHS Shari Prows, MD   15 mg at 05/13/16 2118    Lab Results: No results found for this or any previous visit (from the past 48 hour(s)).  Blood Alcohol level:  Lab Results  Component Value Date   ETH <5 05/06/2016    Metabolic Disorder Labs: No results found for: HGBA1C, MPG No results found for: PROLACTIN No results found for: CHOL,  TRIG, HDL, CHOLHDL, VLDL, LDLCALC  Physical Findings: AIMS:  , ,  ,  ,    CIWA:    COWS:     Musculoskeletal: Strength & Muscle Tone: within normal limits Gait & Station: normal Patient leans: N/A  Psychiatric Specialty Exam: Physical Exam  Nursing note and vitals reviewed.   Review of Systems  Gastrointestinal: Positive for vomiting.  Psychiatric/Behavioral: Positive for depression. The patient is nervous/anxious and has insomnia.     Blood pressure 103/61, pulse 69, temperature 97.7 F (36.5 C), temperature source Oral, resp. rate 18, height 5\' 9"  (1.753 m), weight 81.2 kg (179 lb), SpO2 97 %.Body mass index is 26.43 kg/m.  General Appearance: Casual  Eye Contact:  Good  Speech:  Clear and Coherent  Volume:  Normal  Mood:  Anxious  Affect:  Appropriate  Thought Process:  Goal Directed  Orientation:  Full (Time, Place, and Person)  Thought Content:  Delusions and Paranoid Ideation  Suicidal Thoughts:  No  Homicidal Thoughts:  No  Memory:  Immediate;   Fair Recent;   Fair Remote;   Fair  Judgement:  Impaired  Insight:  Shallow  Psychomotor Activity:  Increased  Concentration:  Concentration: Fair and Attention Span: Fair  Recall:  [employer] of Knowledge:  Fair  Language:  Fair  Akathisia:  No  Handed:  Right  AIMS (if indicated):     Assets:  Communication Skills Desire for Improvement Financial Resources/Insurance Housing Physical Health Resilience Social Support Talents/Skills Transportation Vocational/Educational  ADL's:  Intact  Cognition:  WNL  Sleep:  Number of Hours: 6.5     Treatment Plan Summary: Daily contact with patient to assess and evaluate symptoms and progress in treatment and Medication management   Philip French is an 25 year old male with a history of bipolar disorder admitted for suicidal and homicidal ideation the context of treatment noncompliance and severe academic and social stressors.  1. Suicidal and homicidal ideation.  This has resolved. The patient is able to contract for safety. He is forward thinking and more optimistic about the future. He is a loving son and brother.   2. Agitation. This has resolved.   3. Mood. We started Seroquel and restarted Lamictal titration for mood stabilization.  4. PTSD. We discontinued Minipress upon patient's request.   5. Marijuana use. The patient minimizes his problems and declines treatment.  6. Smoking. Nicotine patch was available.  7. ADHD. We did not offer Vyvanse in the hospital.   8. Anxiety. The patient was asking for Xanax and was offered Vistaril.  9. Insomnia. We started Restoril in addition to Seroquel for sleep.  10. Disposition. He was discharged to home with his family. He will not be returning to school. He will follow up with his  psychiatrist in [place].  Kristine LineaJolanta Carsyn Taubman, MD 05/14/2016, 11:12 AM

## 2016-05-14 NOTE — Progress Notes (Signed)
D: Observed pt in dayroom interacting with peers. Patient alert and oriented x4. Patient denies SI/HI/AVH. Pt affect is silly at times. Pt was appropriate on the unit this evening. Pt can be hyperactive as well, but redirectable. Pt stated his depression is 3/10 and anxiety 2/10. Pt stated "this is the lowest my depression has been since I've been here." Pt talked about his parents being very successful, and that their pressure on him to excel is part of what led to his current situation. Pt mentioned he is having more "vivid dream and wet dream" and asked if medication could be causing. A: Offered active listening and support. Provided therapeutic communication. Administered scheduled medications. Writer could not identify literature that described increased vivid dreams or "wet" dreams with pt's medications. R: Pt pleasant and cooperative. Pt medication compliant. Will continue Q15 min. checks. Safety maintained.

## 2016-05-14 NOTE — Progress Notes (Signed)
Recreation Therapy Notes  Date: 10.13.17 Time: 9:30 am Location: Craft Room  Group Topic: Self-expression/Coping Skills  Goal Area(s) Addresses:  Patient will effectively use art as a means of self-expression. Patient will recognize positive benefit of self-expression. Patient will be able to identify one emotion experienced during group session. Patient will identify use of art/self-expression as a coping skill.  Behavioral Response: Did not attend   Intervention: Two Faces of Me  Activity: Patients were given a blank face worksheet and instructed to draw a line down the middle. On one side, patients were instructed to draw or write how they felt when they were admitted to the hospital and on the other side, patients were instructed to draw or write how they want to feel when they are d/c.  Education: LRT educated patients on other forms of self-expression.  Education Outcome: Patient did not attend group.   Clinical Observations/Feedback: Patient did not attend group.  Jacquelynn CreeGreene,Yalissa Fink M, LRT/CTRS 05/14/2016 10:14 AM
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
## 2016-05-14 NOTE — Plan of Care (Signed)
Problem: Activity: Goal: Interest or engagement in activities will improve Outcome: Progressing Pt attending group and interacting well with peers and staff.

## 2016-05-14 NOTE — [facility] Group Notes (Signed)
[facility] LCSW Group Therapy Relapse Prevention 05/14/2016 1pm Type of Therapy: Group Therapy  Participation Level: Did Not Attend. Patient invited to participate but declined.  Brenlee Koskela LCSW   

## 2016-05-14 NOTE — Progress Notes (Signed)
Patient is pleasant & cooperative.Denies suicidal or homicidal ideations.Appropriate with staff & peers.Attended groups.Appetite & energy level good.Looking forward for discharge on Sunday.

## 2016-05-15 NOTE — Progress Notes (Signed)
D: Observed pt in dayroom interacting with peers. Patient alert and oriented x4. Patient denies SI/HI/AVH. Pt affect is silly at times. Pt was appropriate on the unit this evening. Pt less hyperactive this evening. Pt denied feeling depressed or anxious. Pt stated "I'm back at baseline." Pt talked with Clinical research associatewriter about his plans to redecorate his parents beach house. A: Offered active listening and support. Provided therapeutic communication. Administered scheduled medications.  R: Pt pleasant and cooperative. Pt medication compliant.Will continue Q15 min. checks. Safety maintained.

## 2016-05-15 NOTE — Progress Notes (Signed)
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 He was started on Seroquel and restarted on Lamictal titration. He's been improving every day. He is no longer agitated, labile or floridly psychotic. His parents are coming from [place] tonight to pick him up.   Patient was seen for follow-up. He appeared calm and alert during the interview. He reported that he has been doing well on the medications. He reported that he has taken a medical leave from his school and will be going back with his family to [place]. He is feeling stable and will follow-up with a psychiatrist in [place]. He reported that he was not forthcoming about his symptoms initially but now he has learned about his symptoms. We discussed about his medications in detail. He is compliant with them. He currently denied having any suicidal homicidal ideations or plans. He reported that he is going to help his family and will also take care of his niece as his sister is doing residency.  Patient denied perceptual disturbances. He stated that he is ready to be discharged at this time. No acute symptoms noted.      Principal Problem: Bipolar affective disorder, mixed, severe, with psychotic behavior (HCC) Diagnosis:   Patient Active Problem List   Diagnosis Date Noted  . Attention deficit hyperactivity disorder (ADHD) [F90.9] 05/14/2016  . Cannabis use disorder, moderate, dependence (HCC) [F12.20] 05/10/2016  . Involuntary commitment [Z04.6] 05/10/2016  . Tobacco use disorder [F17.200] 05/10/2016  . Suicidal ideation [R45.851] 05/10/2016  . PTSD (post-traumatic stress disorder) [F43.10] 05/10/2016  . Bipolar affective disorder, mixed, severe, with psychotic behavior (HCC) [F31.64] 05/07/2016   Total  Time spent with patient: 20 minutes  Past Psychiatric History: bipolar disorder.  Past Medical History:  Past Medical History:  Diagnosis Date  . ADHD (attention deficit hyperactivity disorder)   . Anxiety   . Concussion   . Depression   . Kidney stone     Past Surgical History:  Procedure Laterality Date  . LITHOTRIPSY     Family History: History reviewed. No pertinent family history. Family Psychiatric  History: See H&P. Social History:  History  Alcohol Use  . Yes     History  Drug Use No    Social History   Social History  . Marital status: Single    Spouse name: N/A  . Number of children: N/A  . Years of education: N/A   Social History Main Topics  . Smoking status: Light Tobacco Smoker    Types: Cigarettes  . Smokeless tobacco: Never Used  . Alcohol use Yes  . Drug use: No  . Sexual activity: Not Asked   Other Topics Concern  . None   Social History Narrative  . None   Additional Social History:                         Sleep: Fair  Appetite:  Fair  Current Medications: Current Facility-Administered Medications  Medication Dose Route Frequency Provider Last Rate Last Dose  . acetaminophen (TYLENOL) tablet 650 mg  650 mg Oral Q6H PRN Laveda Abbe, NP      . alum & mag hydroxide-simeth (MAALOX/MYLANTA) 200-200-20 MG/5ML suspension 30 mL  30 mL Oral Q4H PRN Laveda Abbe,  NP      . hydrOXYzine (ATARAX/VISTARIL) tablet 25 mg  25 mg Oral TID PRN Shari ProwsJolanta B Pucilowska, MD      . lamoTRIgine (LAMICTAL) tablet 25 mg  25 mg Oral QHS Shari ProwsJolanta B Pucilowska, MD   25 mg at 05/14/16 2154  . magnesium hydroxide (MILK OF MAGNESIA) suspension 30 mL  30 mL Oral Daily PRN Laveda AbbeLaurie Britton Parks, NP      . promethazine (PHENERGAN) tablet 25 mg  25 mg Oral Q6H PRN Shari ProwsJolanta B Pucilowska, MD   25 mg at 05/11/16 0645  . QUEtiapine (SEROQUEL) tablet 200 mg  200 mg Oral QHS Shari ProwsJolanta B Pucilowska, MD   200 mg at 05/14/16 2154  . temazepam (RESTORIL)  capsule 15 mg  15 mg Oral QHS Jolanta B Pucilowska, MD   15 mg at 05/14/16 2154    Lab Results: No results found for this or any previous visit (from the past 48 hour(s)).  Blood Alcohol level:  Lab Results  Component Value Date   ETH <5 05/06/2016    Metabolic Disorder Labs: No results found for: HGBA1C, MPG No results found for: PROLACTIN No results found for: CHOL, TRIG, HDL, CHOLHDL, VLDL, LDLCALC  Physical Findings: AIMS:  , ,  ,  ,    CIWA:    COWS:     Musculoskeletal: Strength & Muscle Tone: within normal limits Gait & Station: normal Patient leans: N/A  Psychiatric Specialty Exam: Physical Exam  Nursing note and vitals reviewed.   ROS  Blood pressure 105/62, pulse 65, temperature 97.9 F (36.6 C), temperature source Oral, resp. rate 20, height 5\' 9"  (1.753 m), weight 179 lb (81.2 kg), SpO2 100 %.Body mass index is 26.43 kg/m.  General Appearance: Casual  Eye Contact:  Good  Speech:  Clear and Coherent  Volume:  Normal  Mood:  Anxious  Affect:  Appropriate  Thought Process:  Goal Directed  Orientation:  Full (Time, Place, and Person)  Thought Content:  Delusions and Paranoid Ideation  Suicidal Thoughts:  No  Homicidal Thoughts:  No  Memory:  Immediate;   Fair Recent;   Fair Remote;   Fair  Judgement:  Fair  Insight:  Shallow  Psychomotor Activity:  Increased  Concentration:  Concentration: Fair and Attention Span: Fair  Recall:  Fis[employer]Fair  Fund of Knowledge:  Fair  Language:  Fair  Akathisia:  No  Handed:  Right  AIMS (if indicated):     Assets:  Communication Skills Desire for Improvement Financial Resources/Insurance Housing Physical Health Resilience Social Support Talents/Skills Transportation Vocational/Educational  ADL's:  Intact  Cognition:  WNL  Sleep:  Number of Hours: 6.25     Treatment Plan Summary: Daily contact with patient to assess and evaluate symptoms and progress in treatment and Medication management   Philip French is  an 25 year old male with a history of bipolar disorder admitted for suicidal and homicidal ideation the context of treatment noncompliance and severe academic and social stressors.  1. Suicidal and homicidal ideation. This has resolved. The patient is able to contract for safety. He is forward thinking and more optimistic about the future. He is a loving son and brother.   2. Agitation. This has resolved.   3. Mood. We started Seroquel and restarted Lamictal titration for mood stabilization.  4. PTSD. We discontinued Minipress upon patient's request.   5. Marijuana use. The patient minimizes his problems and declines treatment.  6. Smoking. Nicotine patch was available.  7. ADHD. We did not offer Vyvanse  in the hospital.   8. Anxiety.improved  9. Insomnia. We started Restoril in addition to Seroquel for sleep.  10. Disposition. He was discharged to home with his family. He will not be returning to school. He will follow up with his psychiatrist in [place].  Brandy Hale, MD 05/15/2016, 3:57 PM

## 2016-05-15 NOTE — [facility] Group Notes (Signed)
[facility] Group Notes:  (Nursing/MHT/Case Management/Adjunct)  Date:  05/15/2016  Time:  9:29 PM  Type of Therapy:  Evening Wrap-up Group  Participation Level:  Active  Participation Quality:  Appropriate and Attentive  Affect:  Appropriate  Cognitive:  Alert and Appropriate  Insight:  Appropriate and Good  Engagement in Group:  Developing/Improving and Engaged  Modes of Intervention:  Discussion  Summary of Progress/Problems: Patient states that he had a good day and is ready to be discharge. Patient also states that he feels his medications are working for him.   Lyly Canizales Nanta Teo Moede 05/15/2016, 9:29 PM

## 2016-05-15 NOTE — Plan of Care (Signed)
Problem: Activity: Goal: Sleeping patterns will improve Outcome: Progressing Pt slept between 6.5-7 hours past 2 nights.

## 2016-05-15 NOTE — Progress Notes (Signed)
Pt has been pleasant and cooperative. Pt has not attended any unit activities but has been active on the unit.Pt denies SI and A/V hallucinations. Pt's mood and affect has been depressed. Pt sates he is glad to be discharged  Tomorrow. Pt's parents are  to pick him up in the AM.

## 2016-05-16 NOTE — Plan of Care (Signed)
Problem: Education: Goal: Emotional status will improve Outcome: Progressing Pt affect is bright. He talks about going home with his parents after d/c and the plans he has to decorate their beach house. Denies SI.  Problem: Health Behavior/Discharge Planning: Goal: Compliance with treatment plan for underlying cause of condition will improve Outcome: Progressing Pt active in groups and taking medications as prescribed.

## 2016-05-16 NOTE — Progress Notes (Signed)
Pt d/c from unit. Parents to unit to pick him up. Pt belongings returned and AVS/Transition record reviewed. Pt verbalizes understanding of information provided.

## 2016-05-16 NOTE — Progress Notes (Signed)
Another pt is observed in this pt's room. Pt is fully clothed. Upon approach, pt states "She came in here. I was telling her we can't talk in here you're breaking the rules." Pt reports "we were just saying our goodbyes." Pt reeducated on unit policies. Pt verbalizes understanding of information provided. Will continue to monitor.

## 2016-05-16 NOTE — Progress Notes (Signed)
  [facility] Discharge Plan :  Will you be returning to the same living situation after discharge:  Yes,  home  At discharge, do you have transportation home?: Yes,  family  Do you have the ability to pay for your medications: Yes,  insurance   Release of information consent forms completed and in the chart;  Patient's signature needed at discharge.  Patient to Follow up at: Follow-up Information    [facility]. Go on 05/18/2016.   Why:  Please arrive to your intake assessment with Dr. Sallee LangeVarma of [facility] at 11:15AM. It is important to arrive 15 minutes early for prompt services and with your discharge paperwork for medications and therapeutic services. Contact information: [place] [address]  [place] [address] Phone: [phone] Fax: [phone]          Next level of care provider has [facility] Link:no  Safety Planning and Suicide Prevention discussed: Yes,  with patient and mother   Have you used any form of tobacco in the last 30 days? (Cigarettes, Smokeless Tobacco, Cigars, and/or Pipes): Yes  Has patient been referred to the Quitline?: Patient refused referral  Patient has been referred for addiction treatment: Yes  Daisy Floroandace L Ashly Yepez MSW, LCSWA  05/16/2016, 11:33 AM

## 2016-05-16 NOTE — Progress Notes (Signed)
D: Pt is pleasant and cooperative this evening. He reports that he is excited to go home in the morning. Pt discusses his plans to decorate his parent's beach house. He reports that he feels like his relationship with his parents is stronger since the beginning of his admission. Denies SI/HI/AVH at this time. A: Emotional support and encouragement provided. Medications administered with education. q15 minute safety checks maintained. R: Pt remains free from harm. Will continue to monitor.
